# Patient Record
Sex: Female | Born: 1955 | Race: Black or African American | Hispanic: No | Marital: Single | Smoking: Never smoker
Health system: Southern US, Community
[De-identification: ages and names within clinical notes are randomized; demographics above are authoritative.]

## PROBLEM LIST (undated history)

## (undated) DIAGNOSIS — F32A Depression, unspecified: Secondary | ICD-10-CM

## (undated) DIAGNOSIS — R51 Headache: Secondary | ICD-10-CM

## (undated) DIAGNOSIS — K219 Gastro-esophageal reflux disease without esophagitis: Secondary | ICD-10-CM

## (undated) DIAGNOSIS — F988 Other specified behavioral and emotional disorders with onset usually occurring in childhood and adolescence: Secondary | ICD-10-CM

## (undated) DIAGNOSIS — F419 Anxiety disorder, unspecified: Secondary | ICD-10-CM

## (undated) DIAGNOSIS — Z974 Presence of external hearing-aid: Secondary | ICD-10-CM

## (undated) DIAGNOSIS — I10 Essential (primary) hypertension: Secondary | ICD-10-CM

## (undated) DIAGNOSIS — G4730 Sleep apnea, unspecified: Secondary | ICD-10-CM

## (undated) DIAGNOSIS — R519 Headache, unspecified: Secondary | ICD-10-CM

## (undated) DIAGNOSIS — T753XXA Motion sickness, initial encounter: Secondary | ICD-10-CM

## (undated) DIAGNOSIS — N730 Acute parametritis and pelvic cellulitis: Secondary | ICD-10-CM

## (undated) DIAGNOSIS — Z973 Presence of spectacles and contact lenses: Secondary | ICD-10-CM

## (undated) DIAGNOSIS — K449 Diaphragmatic hernia without obstruction or gangrene: Secondary | ICD-10-CM

## (undated) DIAGNOSIS — F329 Major depressive disorder, single episode, unspecified: Secondary | ICD-10-CM

## (undated) HISTORY — DX: Essential (primary) hypertension: I10

## (undated) HISTORY — DX: Depression, unspecified: F32.A

## (undated) HISTORY — PX: COLONOSCOPY: SHX174

## (undated) HISTORY — DX: Major depressive disorder, single episode, unspecified: F32.9

## (undated) HISTORY — DX: Anxiety disorder, unspecified: F41.9

## (undated) HISTORY — DX: Acute parametritis and pelvic cellulitis: N73.0

## (undated) HISTORY — DX: Gastro-esophageal reflux disease without esophagitis: K21.9

## (undated) HISTORY — DX: Other specified behavioral and emotional disorders with onset usually occurring in childhood and adolescence: F98.8

---

## 1983-11-06 HISTORY — PX: LAPAROSCOPY: SHX197

## 1983-11-06 HISTORY — PX: BUNIONECTOMY: SHX129

## 2004-09-04 ENCOUNTER — Emergency Department: Payer: Self-pay | Admitting: Emergency Medicine

## 2004-09-04 ENCOUNTER — Other Ambulatory Visit: Payer: Self-pay

## 2007-11-19 ENCOUNTER — Ambulatory Visit: Payer: Self-pay | Admitting: Internal Medicine

## 2007-12-25 ENCOUNTER — Ambulatory Visit: Payer: Self-pay | Admitting: Internal Medicine

## 2008-01-09 ENCOUNTER — Ambulatory Visit: Payer: Self-pay | Admitting: Gastroenterology

## 2008-06-25 ENCOUNTER — Ambulatory Visit: Payer: Self-pay | Admitting: Internal Medicine

## 2009-08-16 ENCOUNTER — Emergency Department: Payer: Self-pay | Admitting: Emergency Medicine

## 2010-04-11 ENCOUNTER — Ambulatory Visit: Payer: Self-pay | Admitting: Internal Medicine

## 2012-11-03 ENCOUNTER — Ambulatory Visit: Payer: Self-pay | Admitting: Internal Medicine

## 2012-11-06 ENCOUNTER — Ambulatory Visit: Payer: Self-pay | Admitting: Internal Medicine

## 2013-11-10 ENCOUNTER — Ambulatory Visit: Payer: Self-pay | Admitting: Internal Medicine

## 2015-06-14 ENCOUNTER — Other Ambulatory Visit: Payer: Self-pay | Admitting: Nurse Practitioner

## 2015-06-14 DIAGNOSIS — K449 Diaphragmatic hernia without obstruction or gangrene: Secondary | ICD-10-CM

## 2015-06-14 DIAGNOSIS — R1013 Epigastric pain: Secondary | ICD-10-CM

## 2015-06-20 ENCOUNTER — Ambulatory Visit
Admission: RE | Admit: 2015-06-20 | Discharge: 2015-06-20 | Disposition: A | Discharge status: Home / Self Care | Payer: Commercial Managed Care - HMO | Source: Ambulatory Visit | Attending: Nurse Practitioner | Admitting: Nurse Practitioner

## 2015-06-20 DIAGNOSIS — R1013 Epigastric pain: Secondary | ICD-10-CM | POA: Diagnosis present

## 2015-06-20 DIAGNOSIS — K219 Gastro-esophageal reflux disease without esophagitis: Secondary | ICD-10-CM | POA: Insufficient documentation

## 2015-06-20 DIAGNOSIS — K449 Diaphragmatic hernia without obstruction or gangrene: Secondary | ICD-10-CM | POA: Diagnosis not present

## 2015-09-27 DIAGNOSIS — G44221 Chronic tension-type headache, intractable: Secondary | ICD-10-CM | POA: Insufficient documentation

## 2015-09-27 DIAGNOSIS — G44329 Chronic post-traumatic headache, not intractable: Secondary | ICD-10-CM | POA: Insufficient documentation

## 2015-11-02 ENCOUNTER — Telehealth: Payer: Self-pay | Admitting: Gastroenterology

## 2015-11-02 ENCOUNTER — Encounter: Payer: Self-pay | Admitting: Nurse Practitioner

## 2015-11-02 NOTE — Telephone Encounter (Signed)
Left a voice message for patient to call and schedule appointment for persistent vomitting, unchanged after reflux meds changed. Notes under media

## 2015-11-16 ENCOUNTER — Ambulatory Visit (INDEPENDENT_AMBULATORY_CARE_PROVIDER_SITE_OTHER): Payer: 59 | Admitting: Obstetrics and Gynecology

## 2015-11-16 ENCOUNTER — Encounter: Payer: Self-pay | Admitting: Obstetrics and Gynecology

## 2015-11-16 VITALS — BP 138/83 | HR 105 | Ht 64.0 in | Wt 190.8 lb

## 2015-11-16 DIAGNOSIS — R109 Unspecified abdominal pain: Secondary | ICD-10-CM

## 2015-11-16 DIAGNOSIS — R1030 Lower abdominal pain, unspecified: Secondary | ICD-10-CM

## 2015-11-16 DIAGNOSIS — R935 Abnormal findings on diagnostic imaging of other abdominal regions, including retroperitoneum: Secondary | ICD-10-CM | POA: Insufficient documentation

## 2015-11-16 DIAGNOSIS — Z780 Asymptomatic menopausal state: Secondary | ICD-10-CM | POA: Insufficient documentation

## 2015-11-16 DIAGNOSIS — J45909 Unspecified asthma, uncomplicated: Secondary | ICD-10-CM | POA: Insufficient documentation

## 2015-11-16 DIAGNOSIS — I10 Essential (primary) hypertension: Secondary | ICD-10-CM | POA: Insufficient documentation

## 2015-11-16 DIAGNOSIS — K219 Gastro-esophageal reflux disease without esophagitis: Secondary | ICD-10-CM | POA: Insufficient documentation

## 2015-11-16 DIAGNOSIS — N882 Stricture and stenosis of cervix uteri: Secondary | ICD-10-CM

## 2015-11-16 DIAGNOSIS — G4730 Sleep apnea, unspecified: Secondary | ICD-10-CM | POA: Insufficient documentation

## 2015-11-16 NOTE — Progress Notes (Signed)
GYN ENCOUNTER NOTE  Subjective:       Jacqueline Arnold is a 60 y.o. G48P1001 female is here for gynecologic evaluation of the following issues:  1.  Abnormal pelvic ultrasound. 2.  Pelvic pain.  60 year old African-American female, para 1, 001, menopausal, on no hormone replacement therapy, presents for evaluation of fluid collection within the endometrial cavity that was obtained on pelvic ultrasound for evaluation of pelvic pain. Over the past month.  The patient developed a UTI and was treated with a course of antibiotics; the antibiotic was not good for the bacteria and subsequently a switch in antibiotic dosage was made; during follow-up exams.  The patient was noted to have mid pelvic tenderness, and ultrasound was obtained.  The ultrasound demonstrated a normal-appearing uterus with thin endometrial stripe, nonvisualization of the ovaries.  However, a prominent fluid collection was noted within the endometrial cavity, of unclear significance.  Patient has not been experiencing any atypical vaginal discharge or vaginal bleeding.  Past gynecologic history: Menarche-age 74. Menopause-age 47. No history of HRT therapy. No history of abnormal uterine bleeding. Laparoscopy (1985) demonstrated scar tissue of unclear origin. No history of abnormal Pap smears. No history of abnormal mammograms.  Past Medical History  Diagnosis Date  . Hypertension   . ADD (attention deficit disorder)   . GERD (gastroesophageal reflux disease)   . Anxiety   . Depression   . PID (acute pelvic inflammatory disease)    Past Surgical History  Procedure Laterality Date  . Laparoscopy  1985  . Bunionectomy Left 1985   OB History  Gravida Para Term Preterm AB SAB TAB Ectopic Multiple Living  1 1 1       1     # Outcome Date GA Lbr Len/2nd Weight Sex Delivery Anes PTL Lv  1 Term 1987   5 lb (2.268 kg) F Vag-Spont   Y       Family History  Problem Relation Age of Onset  . Heart disease Father    . Cancer Neg Hx   . Diabetes Neg Hx     The following portions of the patient's history were reviewed and updated as appropriate: allergies, current medications, past family history, past medical history, past social history, past surgical history and problem list.  Review of Systems Review of Systems - General ROS: negative for - chills, fatigue, fever, hot flashes, malaise or night sweats Hematological and Lymphatic ROS: negative for - bleeding problems or swollen lymph nodes Gastrointestinal ROS: negative for - abdominal pain, blood in stools, change in bowel habits and nausea/vomiting Musculoskeletal ROS: negative for - joint pain, muscle pain or muscular weakness Genito-Urinary ROS: negative for - change in menstrual cycle, dysmenorrhea, dyspareunia, dysuria, genital discharge, genital ulcers, hematuria, incontinence, irregular/heavy menses, nocturia or pelvic pain  Objective:   BP 138/83 mmHg  Pulse 105  Ht 5\' 4"  (1.626 m)  Wt 190 lb 12.8 oz (86.546 kg)  BMI 32.73 kg/m2 CONSTITUTIONAL: Well-developed, well-nourished female in no acute distress.  HENT:  Normocephalic, atraumatic.  NECK: Normal range of motion, supple, no masses.  Normal thyroid.  SKIN: Skin is warm and dry. No rash noted. Not diaphoretic. No erythema. No pallor. [place]: Alert and oriented to person, place, and time. PSYCHIATRIC: Normal mood and affect. Normal behavior. Normal judgment and thought content. CARDIOVASCULAR:Not Examined RESPIRATORY: Not Examined BREASTS: Not Examined ABDOMEN: Soft, non distended; Non tender.  No Organomegaly. PELVIC:  External Genitalia: Normal  BUS: Normal  Vagina: Normal  Cervix: Normal; No cervical motion  tenderness; stenotic  Uterus: Normal size, shape,consistency, mobile, Midplane, normal size and shape, slightly tender  Adnexa: Normal  RV: Normal   Bladder: Nontender MUSCULOSKELETAL: Normal range of motion. No tenderness.  No cyanosis, clubbing, or  edema.  Endometrial Biopsy Procedure Note  Pre-operative Diagnosis: Abnormal pelvic ultrasound  Post-operative Diagnosis: Same  Indications: Abnormal fluid collection within the endometrial cavity  Procedure Details   Urine pregnancy test was not done.  The risks (including infection, bleeding, pain, and uterine perforation) and benefits of the procedure were explained to the patient and Verbal informed consent was obtained.  Antibiotic prophylaxis against endocarditis was not indicated.   The patient was placed in the dorsal lithotomy position.  Bimanual exam showed the uterus to be in the neutral position.  A Graves' speculum inserted in the vagina, and the cervix prepped with povidone iodine.  Endocervical curettage with a Kevorkian curette was not performed. A paracervical block was performed with 10 cc of 1% lidocaine without epinephrine   A Single-toothed tenaculum was applied to the anterior lip of the cervix for stabilization.  A sterile uterine sound was used to Dilate the endocervical canal and sound the uterus to a depth of 8.5cm.  A Mylex 63mm curette was used to sample the endometrium.  Sample was sent for pathologic examination.  Condition: Stable  Complications: None  Plan:  The patient was advised to call for any fever or for prolonged or severe pain or bleeding. She was advised to use OTC acetaminophen and OTC ibuprofen as needed for mild to moderate pain. She was advised to avoid vaginal intercourse for 48 hours or until the bleeding has completely stopped.  Attending Physician Documentation: Brayton Mars, MD    Assessment:   1. Abnormal [place] (ultrasound) of abdomen-Endometrial fluid collection - Pathology  2. Abdominal pain in female  3.  Cervical stenosis     Plan:   1.  Endometrial biopsy with endocervical canal dilation. 2.  Advil/Tylenol as needed for cramping. 3.  Return in 2 weeks for follow-up on endometrial biopsy and further management  planning  Brayton Mars, MD  Note: This dictation was prepared with Dragon dictation along with smaller phrase technology. Any transcriptional errors that result from this process are unintentional.

## 2015-11-16 NOTE — Patient Instructions (Signed)
1.  Endometrial biopsy is done today. 2.  Return in 2 weeks for follow-up on biopsy results. 3.   If you have any cramping, you may use Advil or Tylenol. 4.  you may have some mild spotting.

## 2015-11-21 LAB — PATHOLOGY

## 2015-11-30 ENCOUNTER — Ambulatory Visit: Payer: 59 | Admitting: Obstetrics and Gynecology

## 2015-12-06 ENCOUNTER — Encounter: Payer: Self-pay | Admitting: Gastroenterology

## 2015-12-06 ENCOUNTER — Ambulatory Visit (INDEPENDENT_AMBULATORY_CARE_PROVIDER_SITE_OTHER): Payer: 59 | Admitting: Gastroenterology

## 2015-12-06 VITALS — BP 154/104 | HR 105 | Temp 98.3°F | Ht 64.0 in | Wt 192.0 lb

## 2015-12-06 DIAGNOSIS — G43A0 Cyclical vomiting, not intractable: Secondary | ICD-10-CM

## 2015-12-06 DIAGNOSIS — R1115 Cyclical vomiting syndrome unrelated to migraine: Secondary | ICD-10-CM

## 2015-12-06 NOTE — Progress Notes (Signed)
Gastroenterology Consultation  Referring Provider:     Ronnell Freshwater, NP Primary Care Physician:  Ronnell Freshwater, NP Primary Gastroenterologist:  Dr. Allen Norris     Reason for Consultation:     Nausea and vomiting        HPI:   Jacqueline Arnold is a 60 y.o. y/o female referred for consultation & management of  Nausea vomiting by Dr. Ronnell Freshwater, NP.   This patient comes in today with a history of nausea and vomiting. The patient reports that then nausea and vomiting have been going on for approximately 2 years. She states that symptoms are worse when she wakes up in the morning. She also states that when she lays down at night she can have some vomiting. Sometimes during the day when she is sitting in her car she will also have vomiting. The vomitus is usually consisting of mucus. She denies any food that she has ate many hours before coming up. There's no report of any blood with the vomiting. She also denies any change in bowel habits. The patient states that she had a colonoscopy a few years ago that was normal. The patient also reports that he has not lost any weight and in fact has gained some weight.  Past Medical History  Diagnosis Date  . Hypertension   . ADD (attention deficit disorder)   . GERD (gastroesophageal reflux disease)   . Anxiety   . Depression   . PID (acute pelvic inflammatory disease)     Past Surgical History  Procedure Laterality Date  . Laparoscopy  1985  . Bunionectomy Left 1985    Prior to Admission medications   Medication Sig Start Date End Date Taking? Authorizing Provider  amLODipine (NORVASC) 10 MG tablet  10/17/15  Yes Historical Provider, MD  lisdexamfetamine (VYVANSE) 40 MG capsule take 1 capsule by mouth once daily for FOCUS 09/19/15  Yes Historical Provider, MD  lisinopril (PRINIVIL,ZESTRIL) 20 MG tablet Take by mouth.   Yes Historical Provider, MD  metoCLOPramide (REGLAN) 10 MG tablet take 1 tablet by mouth three times a day if  needed for GERD/VOMITTING 10/20/15  Yes Historical Provider, MD  pantoprazole (PROTONIX) 40 MG tablet  09/21/15  Yes Historical Provider, MD  venlafaxine XR (EFFEXOR-XR) 150 MG 24 hr capsule Take 2 tabs in the morning and one at night 07/15/15  Yes Historical Provider, MD    Family History  Problem Relation Age of Onset  . Heart disease Father   . Cancer Neg Hx   . Diabetes Neg Hx      Social History  Substance Use Topics  . Smoking status: Never Smoker   . Smokeless tobacco: Never Used  . Alcohol Use: Yes     Comment: rare consumption    Allergies as of 12/06/2015  . (No Known Allergies)    Review of Systems:    All systems reviewed and negative except where noted in HPI.   Physical Exam:  BP 154/104 mmHg  Pulse 105  Temp(Src) 98.3 F (36.8 C) (Oral)  Ht 5\' 4"  (1.626 m)  Wt 192 lb (87.091 kg)  BMI 32.94 kg/m2 No LMP recorded. Patient is postmenopausal. Psych:  Alert and cooperative. Normal mood and affect. General:   Alert,  Well-developed, well-nourished, pleasant and cooperative in NAD Head:  Normocephalic and atraumatic. Eyes:  Sclera clear, no icterus.   Conjunctiva pink. Ears:  Normal auditory acuity. Nose:  No deformity, discharge, or lesions. Mouth:  No deformity or  lesions,oropharynx pink & moist. Neck:  Supple; no masses or thyromegaly. Lungs:  Respirations even and unlabored.  Clear throughout to auscultation.   No wheezes, crackles, or rhonchi. No acute distress. Heart:  Regular rate and rhythm; no murmurs, clicks, rubs, or gallops. Abdomen:  Normal bowel sounds.  No bruits.  Soft, Mild epigastric tenderness and non-distended without masses, hepatosplenomegaly or hernias noted.  No guarding or rebound tenderness.  Negative Carnett sign.   Rectal:  Deferred.  Msk:  Symmetrical without gross deformities.  Good, equal movement & strength bilaterally. Pulses:  Normal pulses noted. Extremities:  No clubbing or edema.  No cyanosis. Neurologic:  Alert and  oriented x3;  grossly normal neurologically. Skin:  Intact without significant lesions or rashes.  No jaundice. Lymph Nodes:  No significant cervical adenopathy. Psych:  Alert and cooperative. Normal mood and affect.  Imaging Studies: No results found.  Assessment and Plan:   Jacqueline Arnold is a 60 y.o. y/o female who comes in today with a history of nausea and vomiting for last two years. The patient takes a PPI in the morning once a day. Her symptoms are mostly consistent with nocturnal reflux with resulting morning vomiting. The patient is likely having acid breakthrough while her medication is wearing off in the middle of the night. The patient will be started on a trial of Dexilant to be given a few hours before she goes to sleep. The patient will contact me if this does not help and she may need a gastric emptying study versus a upper endoscopy. The patient had a barium swallow that showed moderate reflux with a hiatal hernia. The patient has been explained the plan and agrees with it.   Note: This dictation was prepared with Dragon dictation along with smaller phrase technology. Any transcriptional errors that result from this process are unintentional.

## 2015-12-13 ENCOUNTER — Telehealth: Payer: Self-pay | Admitting: Obstetrics and Gynecology

## 2015-12-13 NOTE — Telephone Encounter (Signed)
Pt showed up today and thought her appt was today 2/7. It was on 1/25 and she was a no show. She said if you could call her with results she would appreciate it.

## 2015-12-14 NOTE — Telephone Encounter (Signed)
Pt aware per mad emb was wnl. She will need to monitor for any bleeding. If pt develops bleeding after 05/16/2015 she will need to be seen. Pt aware bx is only good for 6 months.

## 2016-01-09 ENCOUNTER — Telehealth: Payer: Self-pay

## 2016-01-09 MED ORDER — DEXLANSOPRAZOLE 60 MG PO CPDR: 60.0000 mg | DELAYED_RELEASE_CAPSULE | Freq: Every day | ORAL | Status: DC

## 2016-01-09 NOTE — Telephone Encounter (Signed)
Patient called stating that Dr. Allen Norris had given her samples for Dexilant. Patient was to try and see if this would help her. Patient stated that it did help so to please send her a prescription to her pharmacy. I told her that I would.

## 2016-04-24 ENCOUNTER — Other Ambulatory Visit: Payer: Self-pay | Admitting: Neurology

## 2016-04-24 DIAGNOSIS — G44319 Acute post-traumatic headache, not intractable: Secondary | ICD-10-CM

## 2016-04-24 DIAGNOSIS — G44229 Chronic tension-type headache, not intractable: Secondary | ICD-10-CM

## 2016-05-18 ENCOUNTER — Ambulatory Visit
Admission: RE | Admit: 2016-05-18 | Discharge: 2016-05-18 | Disposition: A | Discharge status: Home / Self Care | Payer: 59 | Source: Ambulatory Visit | Attending: Neurology | Admitting: Neurology

## 2016-05-18 DIAGNOSIS — G44319 Acute post-traumatic headache, not intractable: Secondary | ICD-10-CM | POA: Diagnosis not present

## 2016-05-18 DIAGNOSIS — G44229 Chronic tension-type headache, not intractable: Secondary | ICD-10-CM | POA: Insufficient documentation

## 2016-05-28 ENCOUNTER — Emergency Department
Admission: EM | Admit: 2016-05-28 | Discharge: 2016-05-28 | Disposition: A | Discharge status: Home / Self Care | Payer: 59 | Attending: Emergency Medicine | Admitting: Emergency Medicine

## 2016-05-28 ENCOUNTER — Telehealth: Payer: Self-pay | Admitting: Gastroenterology

## 2016-05-28 ENCOUNTER — Emergency Department: Payer: 59

## 2016-05-28 ENCOUNTER — Encounter: Payer: Self-pay | Admitting: Emergency Medicine

## 2016-05-28 DIAGNOSIS — F909 Attention-deficit hyperactivity disorder, unspecified type: Secondary | ICD-10-CM | POA: Diagnosis not present

## 2016-05-28 DIAGNOSIS — J45909 Unspecified asthma, uncomplicated: Secondary | ICD-10-CM | POA: Diagnosis not present

## 2016-05-28 DIAGNOSIS — R079 Chest pain, unspecified: Secondary | ICD-10-CM

## 2016-05-28 DIAGNOSIS — I10 Essential (primary) hypertension: Secondary | ICD-10-CM | POA: Diagnosis not present

## 2016-05-28 DIAGNOSIS — R0789 Other chest pain: Secondary | ICD-10-CM | POA: Diagnosis present

## 2016-05-28 DIAGNOSIS — Z79899 Other long term (current) drug therapy: Secondary | ICD-10-CM | POA: Insufficient documentation

## 2016-05-28 LAB — TROPONIN I

## 2016-05-28 LAB — COMPREHENSIVE METABOLIC PANEL
ALT: 17 U/L (ref 14–54)
AST: 28 U/L (ref 15–41)
Albumin: 4.2 g/dL (ref 3.5–5.0)
Alkaline Phosphatase: 78 U/L (ref 38–126)
Anion gap: 16 — ABNORMAL HIGH (ref 5–15)
BILIRUBIN TOTAL: 0.6 mg/dL (ref 0.3–1.2)
BUN: 24 mg/dL — AB (ref 6–20)
CO2: 23 mmol/L (ref 22–32)
Calcium: 9.9 mg/dL (ref 8.9–10.3)
Chloride: 99 mmol/L — ABNORMAL LOW (ref 101–111)
Creatinine, Ser: 1.36 mg/dL — ABNORMAL HIGH (ref 0.44–1.00)
GFR, EST AFRICAN AMERICAN: 49 mL/min — AB (ref >60)
GFR, EST NON AFRICAN AMERICAN: 42 mL/min — AB (ref >60)
Glucose, Bld: 133 mg/dL — ABNORMAL HIGH (ref 65–99)
POTASSIUM: 3.1 mmol/L — AB (ref 3.5–5.1)
Sodium: 138 mmol/L (ref 135–145)
TOTAL PROTEIN: 7.7 g/dL (ref 6.5–8.1)

## 2016-05-28 LAB — CBC
HCT: 40.9 % (ref 35.0–47.0)
HEMOGLOBIN: 13.5 g/dL (ref 12.0–16.0)
MCH: 26.9 pg (ref 26.0–34.0)
MCHC: 32.9 g/dL (ref 32.0–36.0)
MCV: 81.9 fL (ref 80.0–100.0)
Platelets: 342 10*3/uL (ref 150–440)
RBC: 5 MIL/uL (ref 3.80–5.20)
RDW: 15.3 % — AB (ref 11.5–14.5)
WBC: 8.3 10*3/uL (ref 3.6–11.0)

## 2016-05-28 LAB — TYPE AND SCREEN
ABO/RH(D): O POS
ANTIBODY SCREEN: NEGATIVE

## 2016-05-28 LAB — LIPASE, BLOOD: LIPASE: 18 U/L (ref 11–51)

## 2016-05-28 LAB — FIBRIN DERIVATIVES D-DIMER (ARMC ONLY): FIBRIN DERIVATIVES D-DIMER (ARMC): 408 (ref 0–499)

## 2016-05-28 MED ORDER — ONDANSETRON HCL 4 MG/2ML IJ SOLN
4.0000 mg | Freq: Once | INTRAMUSCULAR | Status: AC
  Administered 2016-05-28: 4 mg via INTRAVENOUS

## 2016-05-28 MED ORDER — ONDANSETRON HCL 4 MG/2ML IJ SOLN
INTRAMUSCULAR | Status: AC
  Administered 2016-05-28: 4 mg via INTRAVENOUS
  Filled 2016-05-28: qty 2

## 2016-05-28 MED ORDER — SUCRALFATE 1 G PO TABS: 1.0000 g | ORAL_TABLET | Freq: Four times a day (QID) | ORAL | 0 refills | Status: DC

## 2016-05-28 MED ORDER — ONDANSETRON HCL 4 MG/2ML IJ SOLN: 4.0000 mg | Freq: Once | INTRAMUSCULAR | Status: DC

## 2016-05-28 MED ORDER — GI COCKTAIL ~~LOC~~
30.0000 mL | Freq: Once | ORAL | Status: AC
  Administered 2016-05-28: 30 mL via ORAL
  Filled 2016-05-28: qty 30

## 2016-05-28 MED ORDER — MORPHINE SULFATE (PF) 2 MG/ML IV SOLN
2.0000 mg | Freq: Once | INTRAVENOUS | Status: AC
  Administered 2016-05-28: 2 mg via INTRAVENOUS
  Filled 2016-05-28: qty 1

## 2016-05-28 NOTE — ED Provider Notes (Signed)
[facility] Emergency Department Provider Note    ____________________________________________   I have reviewed the triage vital signs and the nursing notes.   HISTORY  Chief Complaint Chest Pain   History limited by: Not Limited   HPI Jacqueline Arnold is a 60 y.o. female who presents to the emergency department today because of concern for chest and back pain as well as vomiting blood. The patient states that she has been vomiting blood for at least one month. It is burgundy in color. She has been treated for GERD by primary care but does not feel like the medication is helping for that. For the past three days the patient has been having intermittent pain in her left chest and left upper abdomen. The pain is sharp. She denies any fevers.   Past Medical History:  Diagnosis Date  . ADD (attention deficit disorder)   . Anxiety   . Depression   . GERD (gastroesophageal reflux disease)   . Hypertension   . PID (acute pelvic inflammatory disease)     Patient Active Problem List   Diagnosis Date Noted  . Abdominal pain 11/16/2015  . Stenosis, cervix 11/16/2015  . Abnormal ultrasound of uterus 11/16/2015  . Menopause 11/16/2015  . Essential hypertension 11/16/2015  . Asthma 11/16/2015  . Sleep apnea 11/16/2015  . GERD (gastroesophageal reflux disease) 11/16/2015  . Chronic post-traumatic headache 09/27/2015  . Chronic tension-type headache, intractable 09/27/2015    Past Surgical History:  Procedure Laterality Date  . BUNIONECTOMY Left 1985  . LAPAROSCOPY  1985    Current Outpatient Rx  . Order #: [id number] Class: Historical Med  . Order #: [id number] Class: Normal  . Order #: [id number] Class: Historical Med  . Order #: [id number] Class: Historical Med  . Order #: [id number] Class: Historical Med  . Order #: [id number] Class: Historical Med  . Order #: [id number] Class: Historical Med    Allergies Review of patient's allergies indicates no  known allergies.  Family History  Problem Relation Age of Onset  . Heart disease Father   . Cancer Neg Hx   . Diabetes Neg Hx     Social History Social History  Substance Use Topics  . Smoking status: Never Smoker  . Smokeless tobacco: Never Used  . Alcohol use Yes     Comment: rare consumption    Review of Systems  Constitutional: Negative for fever. Cardiovascular: Positive for chest pain.  Respiratory: Negative for shortness of breath. Gastrointestinal: Negative for abdominal pain, vomiting and diarrhea. Neurological: Negative for headaches, focal weakness or numbness.  10-point ROS otherwise negative.  ____________________________________________   PHYSICAL EXAM:  VITAL SIGNS: ED Triage Vitals  Enc Vitals Group     BP 05/28/16 0924 (!) 147/85     Pulse Rate 05/28/16 0924 (!) 123     Resp 05/28/16 0924 14     Temp --      Temp src --      SpO2 05/28/16 0924 99 %     Weight 05/28/16 0924 190 lb (86.2 kg)     Height 05/28/16 0924 5\' 6"  (1.676 m)     Head Circumference --      Peak Flow --      Pain Score 05/28/16 0915 8     Pain Loc --      Pain Edu? --      Excl. in [place]? --      Constitutional: Alert and oriented. Well appearing and in no distress. Eyes: Conjunctivae are normal. PERRL. Normal extraocular  movements. ENT   Head: Normocephalic and atraumatic.   Nose: No congestion/rhinnorhea.   Mouth/Throat: Mucous membranes are moist.   Neck: No stridor. Hematological/Lymphatic/Immunilogical: No cervical lymphadenopathy. Cardiovascular: Normal rate, regular rhythm.  No murmurs, rubs, or gallops. Respiratory: Normal respiratory effort without tachypnea nor retractions. Breath sounds are clear and equal bilaterally. No wheezes/rales/rhonchi. Gastrointestinal: Soft and nontender. No distention. Genitourinary: Deferred Musculoskeletal: Normal range of motion in all extremities. No joint effusions.  No lower extremity tenderness nor  edema. Neurologic:  Normal speech and language. No gross focal neurologic deficits are appreciated.  Skin:  Skin is warm, dry and intact. No rash noted. Psychiatric: Mood and affect are normal. Speech and behavior are normal. Patient exhibits appropriate insight and judgment.  ____________________________________________    LABS (pertinent positives/negatives)  Labs Reviewed  COMPREHENSIVE METABOLIC PANEL - Abnormal; Notable for the following:       Result Value   Potassium 3.1 (*)    Chloride 99 (*)    Glucose, Bld 133 (*)    BUN 24 (*)    Creatinine, Ser 1.36 (*)    GFR calc non Af Amer 42 (*)    GFR calc Af Amer 49 (*)    Anion gap 16 (*)    All other components within normal limits  CBC - Abnormal; Notable for the following:    RDW 15.3 (*)    All other components within normal limits  LIPASE, BLOOD  TROPONIN I  FIBRIN DERIVATIVES D-DIMER ([facility] ONLY)  TYPE AND SCREEN     ____________________________________________   EKG  I, Nance Pear, attending physician, personally viewed and interpreted this EKG  EKG Time: 0907 Rate: 131 Rhythm: sinus tachycardia Axis: normal Intervals: qtc 437 QRS: narrow ST changes: no st elevation Impression: abnormal ekg   I, Nance Pear, attending physician, personally viewed and interpreted this EKG  EKG Time: 1331 Rate: 106 Rhythm: sinus tachycardia Axis: normal Intervals: qtc 451 QRS: narrow ST changes: no st elevation Impression: abnormal ekg   ____________________________________________    RADIOLOGY  Acute abd with chest IMPRESSION: Diffuse stool throughout colon. No bowel obstruction or free air. Lungs clear.  ____________________________________________   PROCEDURES  Procedures  ____________________________________________   INITIAL IMPRESSION / ASSESSMENT AND PLAN / ED COURSE  Pertinent labs & imaging results that were available during my care of the patient were reviewed by me and  considered in my medical decision making (see chart for details).  Patient presents for history of chest pain and vomiting blood. The vomiting blood has been occurring for greater than a month. Hemaglobin today without signs of anemia. The patient had troponin, d-dimer and abd x-ray with chest done all without concerning findings. No signs of leukocytosis. Patient has been on PPI in the past but currently has not been taking any. At this point I think gastritis likely. Instructed patient to restart PPI (she states she still has prescriptions left). Will give prescription for sucralfate. Patient pain free at time of discharge.  ____________________________________________   FINAL CLINICAL IMPRESSION(S) / ED DIAGNOSES  Final diagnoses:  Nonspecific chest pain     Note: This dictation was prepared with Dragon dictation. Any transcriptional errors that result from this process are unintentional    Nance Pear, MD 05/28/16 1535

## 2016-05-28 NOTE — Telephone Encounter (Signed)
Returned pts call but vm box was full.

## 2016-05-28 NOTE — ED Triage Notes (Signed)
Patient presents to the ED with intermittent chest pain radiating into back and left side of patient's abdomen.  Patient is diaphoretic and is complaining of nausea and vomiting with burgundy blood in emesis.  Patient is complaining of mid-chest pressure.  Patient is very anxious in triage.

## 2016-05-28 NOTE — ED Notes (Signed)
Pt asleep and snoring, O2 level dropped into 80's.  Pt placed on 2L O2 for sleep.

## 2016-05-28 NOTE — ED Notes (Signed)
Patient transported to X-ray 

## 2016-05-28 NOTE — Discharge Instructions (Signed)
Please seek medical attention for any high fevers, chest pain, shortness of breath, change in behavior, persistent vomiting, bloody stool or any other new or concerning symptoms.  

## 2016-05-28 NOTE — Telephone Encounter (Signed)
Patient of Dr Allen Norris -  Her epigastric pain has worsened, and she states is also vomiting blood. Has been taking Dexilant, but states it no longer works. Does she need an urgent appointment, procedure or different medication called in to pharmacy?

## 2016-05-29 ENCOUNTER — Other Ambulatory Visit: Payer: Self-pay

## 2016-05-29 MED ORDER — ONDANSETRON 4 MG PO TBDP: 4.0000 mg | ORAL_TABLET | Freq: Three times a day (TID) | ORAL | 0 refills | Status: DC | PRN

## 2016-05-29 NOTE — Telephone Encounter (Signed)
Patient is calling again this morning. I told her that you attempted to call her yesterday and was unable to leave a message as her voice mailbox was full. She is now asking for a same day appointment.  *she was seen in the ER yesterday for chest pain, back pain and vomiting blood*   Please call at your convenience.

## 2016-05-29 NOTE — Telephone Encounter (Signed)
Spoke with pt today and have scheduled her for an EGD on Thursday, July 27th at [facility]. Nausea/vomiting R11.2. Please precert.

## 2016-05-30 ENCOUNTER — Encounter: Payer: Self-pay | Admitting: *Deleted

## 2016-05-30 NOTE — Discharge Instructions (Signed)

## 2016-05-31 ENCOUNTER — Ambulatory Visit: Payer: 59 | Admitting: Anesthesiology

## 2016-05-31 ENCOUNTER — Ambulatory Visit
Admission: RE | Admit: 2016-05-31 | Discharge: 2016-05-31 | Disposition: A | Discharge status: Home / Self Care | Payer: 59 | Source: Ambulatory Visit | Attending: Gastroenterology | Admitting: Gastroenterology

## 2016-05-31 ENCOUNTER — Encounter
Admission: RE | Disposition: A | Discharge status: Home / Self Care | Payer: Self-pay | Source: Ambulatory Visit | Attending: Gastroenterology

## 2016-05-31 DIAGNOSIS — K449 Diaphragmatic hernia without obstruction or gangrene: Secondary | ICD-10-CM

## 2016-05-31 DIAGNOSIS — F329 Major depressive disorder, single episode, unspecified: Secondary | ICD-10-CM | POA: Insufficient documentation

## 2016-05-31 DIAGNOSIS — Z8249 Family history of ischemic heart disease and other diseases of the circulatory system: Secondary | ICD-10-CM | POA: Insufficient documentation

## 2016-05-31 DIAGNOSIS — G4730 Sleep apnea, unspecified: Secondary | ICD-10-CM | POA: Insufficient documentation

## 2016-05-31 DIAGNOSIS — R112 Nausea with vomiting, unspecified: Secondary | ICD-10-CM | POA: Diagnosis not present

## 2016-05-31 DIAGNOSIS — I10 Essential (primary) hypertension: Secondary | ICD-10-CM | POA: Insufficient documentation

## 2016-05-31 DIAGNOSIS — F419 Anxiety disorder, unspecified: Secondary | ICD-10-CM | POA: Insufficient documentation

## 2016-05-31 DIAGNOSIS — Z9889 Other specified postprocedural states: Secondary | ICD-10-CM | POA: Insufficient documentation

## 2016-05-31 DIAGNOSIS — Z79899 Other long term (current) drug therapy: Secondary | ICD-10-CM | POA: Diagnosis not present

## 2016-05-31 DIAGNOSIS — K259 Gastric ulcer, unspecified as acute or chronic, without hemorrhage or perforation: Secondary | ICD-10-CM | POA: Diagnosis not present

## 2016-05-31 DIAGNOSIS — F988 Other specified behavioral and emotional disorders with onset usually occurring in childhood and adolescence: Secondary | ICD-10-CM | POA: Insufficient documentation

## 2016-05-31 DIAGNOSIS — K2100 Gastro-esophageal reflux disease with esophagitis, without bleeding: Secondary | ICD-10-CM

## 2016-05-31 HISTORY — DX: Headache: R51

## 2016-05-31 HISTORY — DX: Sleep apnea, unspecified: G47.30

## 2016-05-31 HISTORY — PX: ESOPHAGOGASTRODUODENOSCOPY (EGD) WITH PROPOFOL: SHX5813

## 2016-05-31 HISTORY — DX: Headache, unspecified: R51.9

## 2016-05-31 HISTORY — DX: Motion sickness, initial encounter: T75.3XXA

## 2016-05-31 HISTORY — DX: Presence of spectacles and contact lenses: Z97.3

## 2016-05-31 SURGERY — ESOPHAGOGASTRODUODENOSCOPY (EGD) WITH PROPOFOL
Anesthesia: Monitor Anesthesia Care | Wound class: Clean Contaminated

## 2016-05-31 MED ORDER — GLYCOPYRROLATE 0.2 MG/ML IJ SOLN
INTRAMUSCULAR | Status: DC | PRN
  Administered 2016-05-31: 0.1 mg via INTRAVENOUS

## 2016-05-31 MED ORDER — LACTATED RINGERS IV SOLN
INTRAVENOUS | Status: DC | PRN
  Administered 2016-05-31: 09:00:00 via INTRAVENOUS

## 2016-05-31 MED ORDER — PROPOFOL 10 MG/ML IV BOLUS
INTRAVENOUS | Status: DC | PRN
  Administered 2016-05-31: 150 mg via INTRAVENOUS
  Administered 2016-05-31: 50 mg via INTRAVENOUS

## 2016-05-31 MED ORDER — LIDOCAINE HCL (CARDIAC) 20 MG/ML IV SOLN
INTRAVENOUS | Status: DC | PRN
  Administered 2016-05-31: 50 mg via INTRAVENOUS

## 2016-05-31 SURGICAL SUPPLY — 32 items
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
## 2016-05-31 NOTE — Anesthesia Procedure Notes (Signed)
Procedure Name: MAC Date/Time: 05/31/2016 9:13 AM Performed by: Cameron Ali Pre-anesthesia Checklist: Patient identified, Emergency Drugs available, Suction available, Timeout performed and Patient being monitored Patient Re-evaluated:Patient Re-evaluated prior to inductionOxygen Delivery Method: Nasal cannula Placement Confirmation: positive ETCO2

## 2016-05-31 NOTE — Op Note (Signed)
[facility] Gastroenterology Patient Name: Jacqueline Arnold Procedure Date: 05/31/2016 9:12 AM MRN: [id number] Account #: [id number] Date of Birth: November 04, 1956 Admit Type: Outpatient Age: 60 Room: [facility] OR ROOM 01 Gender: Female Note Status: Finalized Procedure:            Upper GI endoscopy Indications:          Nausea with vomiting Providers:            Lucilla Lame MD, MD Referring MD:         Lavera Guise, MD (Referring MD) Medicines:            Propofol per Anesthesia Complications:        No immediate complications. Procedure:            Pre-Anesthesia Assessment:                       - Prior to the procedure, a History and Physical was                        performed, and patient medications and allergies were                        reviewed. The patient's tolerance of previous                        anesthesia was also reviewed. The risks and benefits of                        the procedure and the sedation options and risks were                        discussed with the patient. All questions were                        answered, and informed consent was obtained. Prior                        Anticoagulants: The patient has taken no previous                        anticoagulant or antiplatelet agents. ASA Grade                        Assessment: II - A patient with mild systemic disease.                        After reviewing the risks and benefits, the patient was                        deemed in satisfactory condition to undergo the                        procedure.                       After obtaining informed consent, the endoscope was                        passed under direct vision. Throughout the procedure,  the patient's blood pressure, pulse, and oxygen                        saturations were monitored continuously. The was                        introduced through the mouth, and advanced to the    second part of duodenum. The upper GI endoscopy was                        accomplished without difficulty. The patient tolerated                        the procedure well. Findings:      LA Grade D (one or more mucosal breaks involving at least 75% of       esophageal circumference) esophagitis with no bleeding was found in the       lower third of the esophagus. Biopsies were taken with a cold forceps       for histology.      A large hiatal hernia was present.      The stomach was normal.      The examined duodenum was normal. Impression:           - LA Grade D reflux esophagitis. Biopsied.                       - Large hiatal hernia.                       - Normal stomach.                       - Normal examined duodenum. Recommendation:       - Await pathology results. Procedure Code(s):    --- Professional ---                       [phone], Esophagogastroduodenoscopy, flexible, transoral;                        with biopsy, single or multiple Diagnosis Code(s):    --- Professional ---                       R11.2, Nausea with vomiting, unspecified                       K21.0, Gastro-esophageal reflux disease with esophagitis                       K44.9, Diaphragmatic hernia without obstruction or                        gangrene CPT copyright 2016 American Medical Association. All rights reserved. The codes documented in this report are preliminary and upon coder review may  be revised to meet current compliance requirements. Lucilla Lame MD, MD 05/31/2016 9:23:23 AM This report has been signed electronically. Number of Addenda: 0 Note Initiated On: 05/31/2016 9:12 AM Total Procedure Duration: 0 hours 2 minutes 45 seconds       [facility]

## 2016-05-31 NOTE — Anesthesia Preprocedure Evaluation (Signed)
Anesthesia Evaluation  Patient identified by MRN, date of birth, ID band Patient awake    Reviewed: Allergy & Precautions, H&P , NPO status , Patient's Chart, lab work & pertinent test results, reviewed documented beta blocker date and time   Airway Mallampati: II  TM Distance: >3 FB Neck ROM: full    Dental no notable dental hx.    Pulmonary asthma , sleep apnea and Continuous Positive Airway Pressure Ventilation ,    Pulmonary exam normal breath sounds clear to auscultation       Cardiovascular Exercise Tolerance: Good hypertension, On Medications  Rhythm:regular Rate:Normal     Neuro/Psych  Headaches, negative psych ROS   GI/Hepatic negative GI ROS, Neg liver ROS,   Endo/Other  negative endocrine ROS  Renal/GU negative Renal ROS  negative genitourinary   Musculoskeletal   Abdominal   Peds  Hematology negative hematology ROS (+)   Anesthesia Other Findings   Reproductive/Obstetrics negative OB ROS                             Anesthesia Physical Anesthesia Plan  ASA: II  Anesthesia Plan: MAC   Post-op Pain Management:    Induction:   Airway Management Planned:   Additional Equipment:   Intra-op Plan:   Post-operative Plan:   Informed Consent: I have reviewed the patients History and Physical, chart, labs and discussed the procedure including the risks, benefits and alternatives for the proposed anesthesia with the patient or authorized representative who has indicated his/her understanding and acceptance.     Plan Discussed with: CRNA  Anesthesia Plan Comments:         Anesthesia Quick Evaluation

## 2016-05-31 NOTE — Transfer of Care (Signed)
Immediate Anesthesia Transfer of Care Note  Patient: Jacqueline Arnold  Procedure(s) Performed: Procedure(s) with comments: ESOPHAGOGASTRODUODENOSCOPY (EGD) WITH PROPOFOL (N/A) - sleep apnea  Patient Location: PACU  Anesthesia Type: MAC  Level of Consciousness: awake, alert  and patient cooperative  Airway and Oxygen Therapy: Patient Spontanous Breathing and Patient connected to supplemental oxygen  Post-op Assessment: Post-op Vital signs reviewed, Patient's Cardiovascular Status Stable, Respiratory Function Stable, Patent Airway and No signs of Nausea or vomiting  Post-op Vital Signs: Reviewed and stable  Complications: No apparent anesthesia complications

## 2016-05-31 NOTE — Anesthesia Postprocedure Evaluation (Signed)
Anesthesia Post Note  Patient: Jacqueline Arnold  Procedure(s) Performed: Procedure(s) (LRB): ESOPHAGOGASTRODUODENOSCOPY (EGD) WITH PROPOFOL (N/A)  Patient location during evaluation: PACU Anesthesia Type: MAC Level of consciousness: awake and alert Pain management: pain level controlled Vital Signs Assessment: post-procedure vital signs reviewed and stable Respiratory status: spontaneous breathing, nonlabored ventilation and respiratory function stable Cardiovascular status: stable and blood pressure returned to baseline Anesthetic complications: no    DANIEL D KOVACS

## 2016-06-01 ENCOUNTER — Encounter: Payer: Self-pay | Admitting: Gastroenterology

## 2016-06-04 ENCOUNTER — Other Ambulatory Visit: Payer: Self-pay

## 2016-06-05 ENCOUNTER — Encounter: Payer: Self-pay | Admitting: Gastroenterology

## 2016-06-05 ENCOUNTER — Ambulatory Visit (INDEPENDENT_AMBULATORY_CARE_PROVIDER_SITE_OTHER): Payer: 59 | Admitting: Gastroenterology

## 2016-06-05 VITALS — BP 116/75 | HR 116 | Temp 99.0°F | Ht 64.0 in | Wt 198.6 lb

## 2016-06-05 DIAGNOSIS — K209 Esophagitis, unspecified without bleeding: Secondary | ICD-10-CM

## 2016-06-05 DIAGNOSIS — R1110 Vomiting, unspecified: Secondary | ICD-10-CM | POA: Diagnosis not present

## 2016-06-05 LAB — SURGICAL PATHOLOGY

## 2016-06-05 MED ORDER — PANTOPRAZOLE SODIUM 40 MG PO TBEC: 40.0000 mg | DELAYED_RELEASE_TABLET | Freq: Two times a day (BID) | ORAL | 6 refills | Status: DC
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
## 2016-06-08 ENCOUNTER — Other Ambulatory Visit: Payer: Self-pay | Admitting: Cardiology

## 2016-06-08 DIAGNOSIS — R1011 Right upper quadrant pain: Secondary | ICD-10-CM

## 2016-06-11 ENCOUNTER — Ambulatory Visit
Admission: RE | Admit: 2016-06-11 | Discharge: 2016-06-11 | Disposition: A | Discharge status: Home / Self Care | Payer: 59 | Source: Ambulatory Visit | Attending: Nurse Practitioner | Admitting: Nurse Practitioner

## 2016-06-11 ENCOUNTER — Other Ambulatory Visit: Payer: Self-pay | Admitting: Nurse Practitioner

## 2016-06-11 DIAGNOSIS — K228 Other specified diseases of esophagus: Secondary | ICD-10-CM | POA: Insufficient documentation

## 2016-06-11 DIAGNOSIS — R1010 Upper abdominal pain, unspecified: Secondary | ICD-10-CM | POA: Insufficient documentation

## 2016-06-11 MED ORDER — IOPAMIDOL (ISOVUE-300) INJECTION 61%
100.0000 mL | Freq: Once | INTRAVENOUS | Status: AC | PRN
  Administered 2016-06-11: 100 mL via INTRAVENOUS

## 2016-06-15 ENCOUNTER — Ambulatory Visit
Admission: RE | Admit: 2016-06-15 | Discharge: 2016-06-15 | Disposition: A | Discharge status: Home / Self Care | Payer: 59 | Source: Ambulatory Visit | Attending: Cardiology | Admitting: Cardiology

## 2016-06-15 DIAGNOSIS — R1011 Right upper quadrant pain: Secondary | ICD-10-CM | POA: Insufficient documentation

## 2016-06-19 DIAGNOSIS — M549 Dorsalgia, unspecified: Secondary | ICD-10-CM | POA: Diagnosis not present

## 2016-06-19 DIAGNOSIS — R079 Chest pain, unspecified: Secondary | ICD-10-CM | POA: Diagnosis not present

## 2016-06-19 DIAGNOSIS — Z79899 Other long term (current) drug therapy: Secondary | ICD-10-CM | POA: Diagnosis not present

## 2016-06-19 DIAGNOSIS — I10 Essential (primary) hypertension: Secondary | ICD-10-CM | POA: Insufficient documentation

## 2016-06-19 DIAGNOSIS — R112 Nausea with vomiting, unspecified: Secondary | ICD-10-CM | POA: Insufficient documentation

## 2016-06-19 DIAGNOSIS — J45909 Unspecified asthma, uncomplicated: Secondary | ICD-10-CM | POA: Diagnosis not present

## 2016-06-20 ENCOUNTER — Emergency Department: Payer: 59

## 2016-06-20 ENCOUNTER — Emergency Department
Admission: EM | Admit: 2016-06-20 | Discharge: 2016-06-20 | Disposition: A | Discharge status: Home / Self Care | Payer: 59 | Attending: Emergency Medicine | Admitting: Emergency Medicine

## 2016-06-20 ENCOUNTER — Encounter: Payer: Self-pay | Admitting: Emergency Medicine

## 2016-06-20 DIAGNOSIS — R079 Chest pain, unspecified: Secondary | ICD-10-CM

## 2016-06-20 HISTORY — DX: Diaphragmatic hernia without obstruction or gangrene: K44.9

## 2016-06-20 LAB — BASIC METABOLIC PANEL
Anion gap: 7 (ref 5–15)
BUN: 13 mg/dL (ref 6–20)
CHLORIDE: 108 mmol/L (ref 101–111)
CO2: 27 mmol/L (ref 22–32)
CREATININE: 1.04 mg/dL — AB (ref 0.44–1.00)
Calcium: 8.4 mg/dL — ABNORMAL LOW (ref 8.9–10.3)
GFR, EST NON AFRICAN AMERICAN: 58 mL/min — AB (ref >60)
Glucose, Bld: 106 mg/dL — ABNORMAL HIGH (ref 65–99)
POTASSIUM: 3.4 mmol/L — AB (ref 3.5–5.1)
SODIUM: 142 mmol/L (ref 135–145)

## 2016-06-20 LAB — CBC
HCT: 35.1 % (ref 35.0–47.0)
Hemoglobin: 11.7 g/dL — ABNORMAL LOW (ref 12.0–16.0)
MCH: 27.7 pg (ref 26.0–34.0)
MCHC: 33.4 g/dL (ref 32.0–36.0)
MCV: 82.7 fL (ref 80.0–100.0)
PLATELETS: 394 10*3/uL (ref 150–440)
RBC: 4.24 MIL/uL (ref 3.80–5.20)
RDW: 15.3 % — AB (ref 11.5–14.5)
WBC: 9.4 10*3/uL (ref 3.6–11.0)

## 2016-06-20 LAB — TROPONIN I: Troponin I: <0.03 ng/mL (ref <0.03)

## 2016-06-20 MED ORDER — GI COCKTAIL ~~LOC~~
30.0000 mL | Freq: Once | ORAL | Status: AC
  Administered 2016-06-20: 30 mL via ORAL
  Filled 2016-06-20: qty 30

## 2016-06-20 MED ORDER — LIDOCAINE VISCOUS 2 % MT SOLN: 15.0000 mL | Freq: Four times a day (QID) | OROMUCOSAL | 0 refills | Status: DC | PRN

## 2016-06-20 MED ORDER — TRAMADOL HCL 50 MG PO TABS: 50.0000 mg | ORAL_TABLET | Freq: Four times a day (QID) | ORAL | 0 refills | Status: DC | PRN
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
## 2016-06-20 NOTE — ED Triage Notes (Signed)
Pt presents to ED with her boyfriend with c/o left sided chest pain that radiates down her left arm and vomiting blood (X3) since around 2100 tonight. Pt reports a hx of the same about a month ago and was seen in this ED and referred to cardiology and GI specialists. GI MD performed biopsies of her esophagus and was told she had a hiatal hernia Cardiologist was seen Thursday and scheduled a stress test for 30 of august. Pt reports sob only after vomiting. Pt alert and answering questions without difficulty. No increased work of breathing or acute idstress noted at this time. Pt skin warm and dry.

## 2016-06-20 NOTE — ED Notes (Signed)
Patient stated " I throw up twice and it was burgundy color". RN notified

## 2016-06-20 NOTE — Discharge Instructions (Signed)
Please seek medical attention for any high fevers, chest pain, shortness of breath, change in behavior, persistent vomiting, bloody stool or any other new or concerning symptoms.  

## 2016-07-18 ENCOUNTER — Other Ambulatory Visit: Payer: Self-pay | Admitting: Nurse Practitioner

## 2016-07-18 DIAGNOSIS — Z1231 Encounter for screening mammogram for malignant neoplasm of breast: Secondary | ICD-10-CM

## 2016-08-02 ENCOUNTER — Telehealth: Payer: Self-pay

## 2016-08-02 NOTE — Telephone Encounter (Signed)
LMOV to call office .  [facility] referring patient to see Dr. Stevenson Clinch for Cough.

## 2016-08-06 ENCOUNTER — Ambulatory Visit
Admission: RE | Admit: 2016-08-06 | Discharge: 2016-08-06 | Disposition: A | Discharge status: Home / Self Care | Payer: 59 | Source: Ambulatory Visit | Attending: Nurse Practitioner | Admitting: Nurse Practitioner

## 2016-08-06 DIAGNOSIS — Z1231 Encounter for screening mammogram for malignant neoplasm of breast: Secondary | ICD-10-CM | POA: Insufficient documentation

## 2016-08-09 ENCOUNTER — Other Ambulatory Visit: Payer: Self-pay | Admitting: Nurse Practitioner

## 2016-08-09 ENCOUNTER — Institutional Professional Consult (permissible substitution): Payer: Commercial Managed Care - HMO | Admitting: Pulmonary Disease

## 2016-08-09 DIAGNOSIS — N6320 Unspecified lump in the left breast, unspecified quadrant: Secondary | ICD-10-CM

## 2016-08-13 ENCOUNTER — Ambulatory Visit
Admission: RE | Admit: 2016-08-13 | Discharge: 2016-08-13 | Disposition: A | Discharge status: Home / Self Care | Payer: 59 | Source: Ambulatory Visit | Attending: Nurse Practitioner | Admitting: Nurse Practitioner

## 2016-08-13 DIAGNOSIS — N63 Unspecified lump in unspecified breast: Secondary | ICD-10-CM | POA: Diagnosis present

## 2016-08-13 DIAGNOSIS — N6002 Solitary cyst of left breast: Secondary | ICD-10-CM | POA: Insufficient documentation

## 2016-08-13 DIAGNOSIS — N6320 Unspecified lump in the left breast, unspecified quadrant: Secondary | ICD-10-CM

## 2016-08-15 ENCOUNTER — Encounter: Payer: Self-pay | Admitting: Internal Medicine

## 2016-08-15 ENCOUNTER — Encounter (INDEPENDENT_AMBULATORY_CARE_PROVIDER_SITE_OTHER): Payer: Self-pay

## 2016-08-15 ENCOUNTER — Ambulatory Visit (INDEPENDENT_AMBULATORY_CARE_PROVIDER_SITE_OTHER): Payer: 59 | Admitting: Internal Medicine

## 2016-08-15 VITALS — BP 132/74 | HR 100 | Ht 64.0 in | Wt 181.4 lb

## 2016-08-15 DIAGNOSIS — R05 Cough: Secondary | ICD-10-CM

## 2016-08-15 DIAGNOSIS — J4540 Moderate persistent asthma, uncomplicated: Secondary | ICD-10-CM | POA: Diagnosis not present

## 2016-08-15 DIAGNOSIS — R059 Cough, unspecified: Secondary | ICD-10-CM

## 2016-08-15 MED ORDER — FLUTICASONE FUROATE-VILANTEROL 200-25 MCG/INH IN AEPB: 1.0000 | INHALATION_SPRAY | Freq: Every day | RESPIRATORY_TRACT | 0 refills | Status: AC

## 2016-08-15 MED ORDER — FLUTICASONE PROPIONATE 50 MCG/ACT NA SUSP: 1.0000 | Freq: Two times a day (BID) | NASAL | 2 refills | Status: DC

## 2016-08-15 MED ORDER — OMEPRAZOLE 20 MG PO CPDR: 20.0000 mg | DELAYED_RELEASE_CAPSULE | Freq: Every day | ORAL | 1 refills | Status: DC

## 2016-08-15 MED ORDER — PREDNISONE 10 MG PO TABS: ORAL_TABLET | ORAL | 0 refills | Status: DC

## 2016-08-15 MED ORDER — FLUTICASONE FUROATE-VILANTEROL 200-25 MCG/INH IN AEPB: 1.0000 | INHALATION_SPRAY | Freq: Every day | RESPIRATORY_TRACT | 5 refills | Status: DC

## 2016-08-15 NOTE — Progress Notes (Signed)
Patient ID: Jacqueline Arnold, female   DOB: Jul 26, 1956, 60 y.o.   MRN: [id number] Patient seen in the office today and instructed on use of breo ellipta.  Patient expressed understanding and demonstrated technique.
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
## 2016-08-15 NOTE — Patient Instructions (Addendum)
--  Will start omeprazole 20 mg daily.   --Flonase 1 spray each nostril twice daily.   --Breo 200 once daily, rinse mouth after each use.   --Prednisone 10 mg tabs x 42.  Take 6 tabs for 2 days, then 5 tabs for 2 day, 4 tabs for 2 day, 3 tabs for 2 days, 1 tab for 2 days- then stop.

## 2016-09-19 ENCOUNTER — Ambulatory Visit: Payer: 59 | Admitting: Internal Medicine

## 2016-09-19 ENCOUNTER — Encounter: Payer: Self-pay | Admitting: *Deleted
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
## 2016-09-25 ENCOUNTER — Encounter: Payer: Self-pay | Admitting: Internal Medicine

## 2017-09-12 ENCOUNTER — Other Ambulatory Visit: Payer: Self-pay | Admitting: Nurse Practitioner

## 2017-09-12 ENCOUNTER — Other Ambulatory Visit: Payer: Self-pay | Admitting: Internal Medicine

## 2017-09-12 DIAGNOSIS — Z1231 Encounter for screening mammogram for malignant neoplasm of breast: Secondary | ICD-10-CM

## 2017-10-08 ENCOUNTER — Ambulatory Visit
Admission: RE | Admit: 2017-10-08 | Discharge: 2017-10-08 | Disposition: A | Discharge status: Home / Self Care | Payer: 59 | Source: Ambulatory Visit | Attending: Nurse Practitioner | Admitting: Nurse Practitioner

## 2017-10-08 DIAGNOSIS — Z1231 Encounter for screening mammogram for malignant neoplasm of breast: Secondary | ICD-10-CM | POA: Insufficient documentation

## 2017-11-12 ENCOUNTER — Encounter: Payer: Self-pay | Admitting: Nurse Practitioner

## 2017-11-26 ENCOUNTER — Other Ambulatory Visit: Payer: Self-pay

## 2018-01-03 ENCOUNTER — Encounter: Payer: Self-pay | Admitting: Nurse Practitioner

## 2018-01-08 ENCOUNTER — Ambulatory Visit (INDEPENDENT_AMBULATORY_CARE_PROVIDER_SITE_OTHER): Payer: Managed Care, Other (non HMO) | Admitting: Nurse Practitioner

## 2018-01-08 ENCOUNTER — Encounter: Payer: Self-pay | Admitting: Nurse Practitioner

## 2018-01-08 VITALS — BP 140/90 | HR 80 | Resp 16 | Ht 64.0 in | Wt 174.0 lb

## 2018-01-08 DIAGNOSIS — R300 Dysuria: Secondary | ICD-10-CM | POA: Diagnosis not present

## 2018-01-08 DIAGNOSIS — I10 Essential (primary) hypertension: Secondary | ICD-10-CM

## 2018-01-08 DIAGNOSIS — Z124 Encounter for screening for malignant neoplasm of cervix: Secondary | ICD-10-CM

## 2018-01-08 DIAGNOSIS — F329 Major depressive disorder, single episode, unspecified: Secondary | ICD-10-CM | POA: Insufficient documentation

## 2018-01-08 DIAGNOSIS — Z0001 Encounter for general adult medical examination with abnormal findings: Secondary | ICD-10-CM

## 2018-01-08 DIAGNOSIS — F341 Dysthymic disorder: Secondary | ICD-10-CM

## 2018-01-08 DIAGNOSIS — E668 Other obesity: Secondary | ICD-10-CM | POA: Insufficient documentation

## 2018-01-08 DIAGNOSIS — E559 Vitamin D deficiency, unspecified: Secondary | ICD-10-CM | POA: Insufficient documentation

## 2018-01-08 DIAGNOSIS — L209 Atopic dermatitis, unspecified: Secondary | ICD-10-CM | POA: Diagnosis not present

## 2018-01-08 MED ORDER — AMLODIPINE BESYLATE 10 MG PO TABS: 10.0000 mg | ORAL_TABLET | Freq: Every day | ORAL | 4 refills | Status: DC

## 2018-01-08 MED ORDER — CLOTRIMAZOLE-BETAMETHASONE 1-0.05 % EX CREA: 1.0000 "application " | TOPICAL_CREAM | Freq: Two times a day (BID) | CUTANEOUS | 1 refills | Status: DC

## 2018-01-08 MED ORDER — LIRAGLUTIDE -WEIGHT MANAGEMENT 18 MG/3ML ~~LOC~~ SOPN: 3.0000 mg | PEN_INJECTOR | Freq: Every day | SUBCUTANEOUS | 4 refills | Status: DC

## 2018-01-08 MED ORDER — VENLAFAXINE HCL ER 150 MG PO CP24: 150.0000 mg | ORAL_CAPSULE | Freq: Every day | ORAL | 4 refills | Status: DC
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
## 2018-01-09 LAB — MICROSCOPIC EXAMINATION: Casts: NONE SEEN /lpf

## 2018-01-09 LAB — URINALYSIS, ROUTINE W REFLEX MICROSCOPIC
BILIRUBIN UA: NEGATIVE
Glucose, UA: NEGATIVE
Nitrite, UA: NEGATIVE
Protein, UA: NEGATIVE
RBC UA: NEGATIVE
SPEC GRAV UA: 1.02 (ref 1.005–1.030)
Urobilinogen, Ur: 0.2 mg/dL (ref 0.2–1.0)
pH, UA: 5 (ref 5.0–7.5)

## 2018-01-11 LAB — PAP IG AND HPV HIGH-RISK
HPV, HIGH-RISK: NEGATIVE
PAP Smear Comment: 0

## 2018-01-15 ENCOUNTER — Telehealth: Payer: Self-pay

## 2018-01-15 NOTE — Telephone Encounter (Signed)
Left vm that pap results were normal.  dbs

## 2018-01-15 NOTE — Telephone Encounter (Signed)
-----   Message from Ronnell Freshwater, NP sent at 01/11/2018  7:46 PM EST ----- Please let the patient know that her pap smear is normal. thanks
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
## 2018-07-03 NOTE — Progress Notes (Signed)
[place] 12/13/2018

## 2018-11-27 ENCOUNTER — Other Ambulatory Visit: Payer: Self-pay | Admitting: Internal Medicine

## 2018-11-27 DIAGNOSIS — Z1231 Encounter for screening mammogram for malignant neoplasm of breast: Secondary | ICD-10-CM

## 2018-12-11 ENCOUNTER — Ambulatory Visit
Admission: RE | Admit: 2018-12-11 | Discharge: 2018-12-11 | Disposition: A | Discharge status: Home / Self Care | Payer: Managed Care, Other (non HMO) | Source: Ambulatory Visit | Attending: Internal Medicine | Admitting: Internal Medicine

## 2018-12-11 DIAGNOSIS — Z1231 Encounter for screening mammogram for malignant neoplasm of breast: Secondary | ICD-10-CM | POA: Diagnosis not present

## 2019-02-09 ENCOUNTER — Ambulatory Visit: Payer: Managed Care, Other (non HMO) | Admitting: Adult Health

## 2019-02-09 ENCOUNTER — Other Ambulatory Visit: Payer: Self-pay

## 2019-02-09 ENCOUNTER — Encounter: Payer: Self-pay | Admitting: Adult Health

## 2019-02-09 VITALS — BP 129/101 | HR 125 | Resp 16 | Ht 64.0 in | Wt 183.0 lb

## 2019-02-09 DIAGNOSIS — I10 Essential (primary) hypertension: Secondary | ICD-10-CM

## 2019-02-09 DIAGNOSIS — F329 Major depressive disorder, single episode, unspecified: Secondary | ICD-10-CM | POA: Diagnosis not present

## 2019-02-09 MED ORDER — VENLAFAXINE HCL ER 150 MG PO CP24: 150.0000 mg | ORAL_CAPSULE | Freq: Every day | ORAL | 0 refills | Status: DC

## 2019-02-09 MED ORDER — VENLAFAXINE HCL ER 150 MG PO CP24: 150.0000 mg | ORAL_CAPSULE | Freq: Every day | ORAL | 3 refills | Status: DC
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
## 2019-02-10 ENCOUNTER — Telehealth: Payer: Self-pay | Admitting: Adult Health

## 2019-02-10 NOTE — Telephone Encounter (Signed)
Spoke with patient in regards to her visit yesterday , patients bp has come down, 136/89 and pulse is 104 . Patient states that she still has that throbby toothache going on with the left arm and side of chest , advised patient to call If symptoms worsen , and or lingers on , she will need to be seen, advised not to hesitate if she feels she needs to go to the emergency room.

## 2019-02-11 ENCOUNTER — Ambulatory Visit: Payer: Managed Care, Other (non HMO) | Admitting: Internal Medicine

## 2019-02-11 ENCOUNTER — Telehealth: Payer: Self-pay

## 2019-02-11 ENCOUNTER — Other Ambulatory Visit: Payer: Self-pay

## 2019-02-11 ENCOUNTER — Telehealth: Payer: Self-pay | Admitting: Adult Health

## 2019-02-11 ENCOUNTER — Other Ambulatory Visit: Payer: Self-pay | Admitting: Internal Medicine

## 2019-02-11 ENCOUNTER — Encounter: Payer: Self-pay | Admitting: Internal Medicine

## 2019-02-11 VITALS — BP 139/84 | HR 102 | Resp 16 | Ht 64.0 in | Wt 183.0 lb

## 2019-02-11 DIAGNOSIS — R079 Chest pain, unspecified: Secondary | ICD-10-CM | POA: Diagnosis not present

## 2019-02-11 DIAGNOSIS — I10 Essential (primary) hypertension: Secondary | ICD-10-CM

## 2019-02-11 DIAGNOSIS — J208 Acute bronchitis due to other specified organisms: Secondary | ICD-10-CM | POA: Diagnosis not present

## 2019-02-11 MED ORDER — FLUTICASONE-SALMETEROL 115-21 MCG/ACT IN AERO: 2.0000 | INHALATION_SPRAY | Freq: Two times a day (BID) | RESPIRATORY_TRACT | 1 refills | Status: DC

## 2019-02-11 MED ORDER — AZITHROMYCIN 250 MG PO TABS: ORAL_TABLET | ORAL | 0 refills | Status: DC

## 2019-02-11 NOTE — Telephone Encounter (Signed)
Called ph[facility] and make sure they received both pres and they did working on zpak and advair inhaler

## 2019-02-11 NOTE — Telephone Encounter (Signed)
She called and states that her symptoms have changed some, she states that her bp is under control but her cough more productive and she is having a sore throat Adam did a video call with her on Monday , would you like to do a video call with her or have any suggestions for her?
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
## 2019-02-11 NOTE — Telephone Encounter (Signed)
Pt advised that we send zpak  And inhaler to [facility] and we call back today or tomorrow with result

## 2019-02-12 ENCOUNTER — Telehealth: Payer: Self-pay

## 2019-02-12 ENCOUNTER — Other Ambulatory Visit: Payer: Self-pay

## 2019-02-12 DIAGNOSIS — I10 Essential (primary) hypertension: Secondary | ICD-10-CM

## 2019-02-12 LAB — COMPREHENSIVE METABOLIC PANEL
ALT: 35 IU/L — ABNORMAL HIGH (ref 0–32)
AST: 25 IU/L (ref 0–40)
Albumin/Globulin Ratio: 1.7 (ref 1.2–2.2)
Albumin: 4 g/dL (ref 3.8–4.9)
Alkaline Phosphatase: 83 IU/L (ref 39–117)
BUN/Creatinine Ratio: 18 (ref 12–28)
BUN: 16 mg/dL (ref 8–27)
Bilirubin Total: 0.4 mg/dL (ref 0.0–1.2)
CO2: 20 mmol/L (ref 20–29)
Calcium: 9 mg/dL (ref 8.7–10.3)
Chloride: 106 mmol/L (ref 96–106)
Creatinine, Ser: 0.89 mg/dL (ref 0.57–1.00)
GFR calc Af Amer: 81 mL/min/{1.73_m2} (ref >59)
GFR calc non Af Amer: 71 mL/min/{1.73_m2} (ref >59)
Globulin, Total: 2.3 g/dL (ref 1.5–4.5)
Glucose: 100 mg/dL — ABNORMAL HIGH (ref 65–99)
Potassium: 4.1 mmol/L (ref 3.5–5.2)
Sodium: 141 mmol/L (ref 134–144)
Total Protein: 6.3 g/dL (ref 6.0–8.5)

## 2019-02-12 LAB — CBC WITH DIFFERENTIAL
Basophils Absolute: 0.1 10*3/uL (ref 0.0–0.2)
Basos: 1 %
EOS (ABSOLUTE): 0.2 10*3/uL (ref 0.0–0.4)
Eos: 3 %
Hematocrit: 33.2 % — ABNORMAL LOW (ref 34.0–46.6)
Hemoglobin: 11.2 g/dL (ref 11.1–15.9)
Immature Grans (Abs): 0 10*3/uL (ref 0.0–0.1)
Immature Granulocytes: 0 %
Lymphocytes Absolute: 2.1 10*3/uL (ref 0.7–3.1)
Lymphs: 28 %
MCH: 28.2 pg (ref 26.6–33.0)
MCHC: 33.7 g/dL (ref 31.5–35.7)
MCV: 84 fL (ref 79–97)
Monocytes Absolute: 0.5 10*3/uL (ref 0.1–0.9)
Monocytes: 6 %
Neutrophils Absolute: 4.6 10*3/uL (ref 1.4–7.0)
Neutrophils: 62 %
RBC: 3.97 x10E6/uL (ref 3.77–5.28)
RDW: 13.6 % (ref 11.7–15.4)
WBC: 7.6 10*3/uL (ref 3.4–10.8)

## 2019-02-12 LAB — CK TOTAL AND CKMB (NOT AT ARMC)
CK-MB Index: 2.1 ng/mL (ref 0.0–5.3)
Total CK: 224 U/L — ABNORMAL HIGH (ref 24–173)

## 2019-02-12 LAB — TROPONIN I: Troponin I: <0.01 ng/mL (ref 0.00–0.04)

## 2019-02-12 LAB — D-DIMER, QUANTITATIVE: D-DIMER: 0.27 mg/L FEU (ref 0.00–0.49)

## 2019-02-12 MED ORDER — AMLODIPINE BESYLATE 10 MG PO TABS: 10.0000 mg | ORAL_TABLET | Freq: Every day | ORAL | 0 refills | Status: DC

## 2019-02-12 NOTE — Telephone Encounter (Signed)
Spoke with she doing better and she going start med today

## 2019-02-12 NOTE — Telephone Encounter (Signed)
Faxed signed copies of patient disability paperwork back to employer [employer] and put copy in scan. Beth

## 2019-02-16 NOTE — Progress Notes (Signed)
Can u check on her please

## 2019-02-17 ENCOUNTER — Telehealth: Payer: Self-pay

## 2019-02-17 NOTE — Telephone Encounter (Signed)
Called to check on pt did not get an answer left a message for pt to give [place] a call back.

## 2019-03-17 ENCOUNTER — Encounter: Payer: Self-pay | Admitting: Adult Health

## 2019-05-05 ENCOUNTER — Other Ambulatory Visit: Payer: Self-pay | Admitting: Internal Medicine

## 2019-05-05 DIAGNOSIS — I10 Essential (primary) hypertension: Secondary | ICD-10-CM

## 2019-05-14 ENCOUNTER — Ambulatory Visit: Payer: Managed Care, Other (non HMO) | Admitting: Nurse Practitioner

## 2019-06-09 ENCOUNTER — Encounter: Payer: Self-pay | Admitting: Adult Health

## 2019-07-30 ENCOUNTER — Other Ambulatory Visit: Payer: Self-pay

## 2019-07-30 DIAGNOSIS — I10 Essential (primary) hypertension: Secondary | ICD-10-CM

## 2019-07-30 MED ORDER — AMLODIPINE BESYLATE 10 MG PO TABS: 10.0000 mg | ORAL_TABLET | Freq: Every day | ORAL | 0 refills | Status: DC

## 2019-08-18 ENCOUNTER — Other Ambulatory Visit: Payer: Self-pay

## 2019-08-18 ENCOUNTER — Encounter: Payer: Self-pay | Admitting: Adult Health

## 2019-08-18 ENCOUNTER — Ambulatory Visit: Payer: Managed Care, Other (non HMO) | Admitting: Adult Health

## 2019-08-18 VITALS — Ht 64.0 in | Wt 172.0 lb

## 2019-08-18 DIAGNOSIS — F329 Major depressive disorder, single episode, unspecified: Secondary | ICD-10-CM | POA: Diagnosis not present

## 2019-08-18 DIAGNOSIS — L209 Atopic dermatitis, unspecified: Secondary | ICD-10-CM

## 2019-08-18 DIAGNOSIS — I10 Essential (primary) hypertension: Secondary | ICD-10-CM

## 2019-08-18 DIAGNOSIS — K219 Gastro-esophageal reflux disease without esophagitis: Secondary | ICD-10-CM | POA: Diagnosis not present

## 2019-08-18 MED ORDER — BUPROPION HCL 100 MG PO TABS: 100.0000 mg | ORAL_TABLET | Freq: Every day | ORAL | 2 refills | Status: DC

## 2019-08-18 MED ORDER — CLOTRIMAZOLE-BETAMETHASONE 1-0.05 % EX CREA: 1.0000 "application " | TOPICAL_CREAM | Freq: Two times a day (BID) | CUTANEOUS | 1 refills | Status: DC

## 2019-08-18 MED ORDER — AMLODIPINE BESYLATE 10 MG PO TABS: 10.0000 mg | ORAL_TABLET | Freq: Every day | ORAL | 0 refills | Status: DC

## 2019-08-18 MED ORDER — OMEPRAZOLE 40 MG PO CPDR: 40.0000 mg | DELAYED_RELEASE_CAPSULE | Freq: Every day | ORAL | 3 refills | Status: DC
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
## 2019-09-25 ENCOUNTER — Telehealth: Payer: Self-pay

## 2019-09-25 NOTE — Telephone Encounter (Signed)
SCREENED AND CONFIRMED 09-29-19 OV.

## 2019-09-25 NOTE — Telephone Encounter (Signed)
Called lmom informing patient of appointment. klh 

## 2019-09-29 ENCOUNTER — Ambulatory Visit: Payer: Managed Care, Other (non HMO) | Admitting: Adult Health

## 2019-09-29 ENCOUNTER — Other Ambulatory Visit: Payer: Self-pay

## 2019-09-29 ENCOUNTER — Encounter: Payer: Self-pay | Admitting: Adult Health

## 2019-09-29 ENCOUNTER — Encounter (INDEPENDENT_AMBULATORY_CARE_PROVIDER_SITE_OTHER): Payer: Self-pay

## 2019-09-29 VITALS — BP 140/84 | HR 96 | Temp 97.1°F | Resp 16 | Ht 64.0 in | Wt 177.0 lb

## 2019-09-29 DIAGNOSIS — F329 Major depressive disorder, single episode, unspecified: Secondary | ICD-10-CM | POA: Diagnosis not present

## 2019-09-29 DIAGNOSIS — K219 Gastro-esophageal reflux disease without esophagitis: Secondary | ICD-10-CM

## 2019-09-29 DIAGNOSIS — Z23 Encounter for immunization: Secondary | ICD-10-CM

## 2019-09-29 DIAGNOSIS — I10 Essential (primary) hypertension: Secondary | ICD-10-CM | POA: Diagnosis not present

## 2019-09-29 DIAGNOSIS — M25539 Pain in unspecified wrist: Secondary | ICD-10-CM | POA: Insufficient documentation
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
## 2019-10-26 ENCOUNTER — Other Ambulatory Visit: Payer: Self-pay

## 2019-10-26 DIAGNOSIS — F329 Major depressive disorder, single episode, unspecified: Secondary | ICD-10-CM

## 2019-10-26 MED ORDER — BUPROPION HCL 100 MG PO TABS: 100.0000 mg | ORAL_TABLET | Freq: Every day | ORAL | 1 refills | Status: DC

## 2019-10-29 ENCOUNTER — Telehealth: Payer: Self-pay

## 2019-10-29 NOTE — Telephone Encounter (Signed)
Confirmed appointment with patient. klh °

## 2019-11-03 ENCOUNTER — Ambulatory Visit: Payer: Managed Care, Other (non HMO) | Admitting: Adult Health

## 2019-11-03 DIAGNOSIS — Z008 Encounter for other general examination: Secondary | ICD-10-CM

## 2019-11-13 ENCOUNTER — Telehealth: Payer: Self-pay

## 2019-11-13 NOTE — Telephone Encounter (Signed)
Billed patient missed appt fee. 11/03/19

## 2019-11-20 ENCOUNTER — Other Ambulatory Visit: Payer: Self-pay | Admitting: Adult Health

## 2019-11-20 DIAGNOSIS — I10 Essential (primary) hypertension: Secondary | ICD-10-CM

## 2019-11-22 ENCOUNTER — Other Ambulatory Visit: Payer: Self-pay | Admitting: Adult Health

## 2019-11-22 DIAGNOSIS — F329 Major depressive disorder, single episode, unspecified: Secondary | ICD-10-CM

## 2019-12-07 ENCOUNTER — Other Ambulatory Visit: Payer: Self-pay

## 2019-12-07 DIAGNOSIS — K219 Gastro-esophageal reflux disease without esophagitis: Secondary | ICD-10-CM

## 2019-12-07 MED ORDER — OMEPRAZOLE 40 MG PO CPDR: 40.0000 mg | DELAYED_RELEASE_CAPSULE | Freq: Every day | ORAL | 1 refills | Status: DC

## 2019-12-30 ENCOUNTER — Other Ambulatory Visit: Payer: Self-pay | Admitting: Adult Health

## 2019-12-30 DIAGNOSIS — F329 Major depressive disorder, single episode, unspecified: Secondary | ICD-10-CM

## 2020-01-04 ENCOUNTER — Other Ambulatory Visit: Payer: Self-pay

## 2020-01-04 DIAGNOSIS — F329 Major depressive disorder, single episode, unspecified: Secondary | ICD-10-CM

## 2020-01-04 MED ORDER — BUPROPION HCL 100 MG PO TABS: 100.0000 mg | ORAL_TABLET | Freq: Every day | ORAL | 0 refills | Status: DC

## 2020-01-07 ENCOUNTER — Other Ambulatory Visit: Payer: Self-pay

## 2020-01-07 DIAGNOSIS — I10 Essential (primary) hypertension: Secondary | ICD-10-CM

## 2020-01-07 MED ORDER — AMLODIPINE BESYLATE 10 MG PO TABS: ORAL_TABLET | ORAL | 0 refills | Status: DC

## 2020-02-02 ENCOUNTER — Telehealth: Payer: Self-pay

## 2020-02-02 NOTE — Telephone Encounter (Signed)
Confirmed appointment on 02/04/2020 and screened for covid. klh 

## 2020-02-03 ENCOUNTER — Ambulatory Visit: Payer: Managed Care, Other (non HMO) | Admitting: Adult Health

## 2020-02-04 ENCOUNTER — Encounter: Payer: Self-pay | Admitting: Adult Health

## 2020-02-04 ENCOUNTER — Other Ambulatory Visit: Payer: Self-pay

## 2020-02-04 ENCOUNTER — Ambulatory Visit: Payer: Managed Care, Other (non HMO) | Admitting: Adult Health

## 2020-02-04 VITALS — BP 127/90 | HR 102 | Temp 97.5°F | Resp 16 | Ht 64.0 in | Wt 183.0 lb

## 2020-02-04 DIAGNOSIS — R300 Dysuria: Secondary | ICD-10-CM | POA: Diagnosis not present

## 2020-02-04 DIAGNOSIS — N390 Urinary tract infection, site not specified: Secondary | ICD-10-CM

## 2020-02-04 DIAGNOSIS — F329 Major depressive disorder, single episode, unspecified: Secondary | ICD-10-CM

## 2020-02-04 DIAGNOSIS — I10 Essential (primary) hypertension: Secondary | ICD-10-CM

## 2020-02-04 DIAGNOSIS — K219 Gastro-esophageal reflux disease without esophagitis: Secondary | ICD-10-CM

## 2020-02-04 LAB — POCT URINALYSIS DIPSTICK
Bilirubin, UA: NEGATIVE
Blood, UA: NEGATIVE
Glucose, UA: NEGATIVE
Ketones, UA: NEGATIVE
Nitrite, UA: NEGATIVE
Protein, UA: POSITIVE — AB
Spec Grav, UA: 1.01 (ref 1.010–1.025)
Urobilinogen, UA: 0.2 E.U./dL
pH, UA: 5 (ref 5.0–8.0)

## 2020-02-04 MED ORDER — OMEPRAZOLE 40 MG PO CPDR: 40.0000 mg | DELAYED_RELEASE_CAPSULE | Freq: Every day | ORAL | 1 refills | Status: DC

## 2020-02-04 MED ORDER — BUPROPION HCL ER (SR) 150 MG PO TB12: 150.0000 mg | ORAL_TABLET | Freq: Two times a day (BID) | ORAL | 0 refills | Status: DC

## 2020-02-04 MED ORDER — NITROFURANTOIN MONOHYD MACRO 100 MG PO CAPS: 100.0000 mg | ORAL_CAPSULE | Freq: Two times a day (BID) | ORAL | 0 refills | Status: DC
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
## 2020-02-08 LAB — CULTURE, URINE COMPREHENSIVE

## 2020-03-20 ENCOUNTER — Other Ambulatory Visit: Payer: Self-pay | Admitting: Adult Health

## 2020-03-20 DIAGNOSIS — F329 Major depressive disorder, single episode, unspecified: Secondary | ICD-10-CM

## 2020-03-27 ENCOUNTER — Other Ambulatory Visit: Payer: Self-pay | Admitting: Adult Health

## 2020-03-27 DIAGNOSIS — I10 Essential (primary) hypertension: Secondary | ICD-10-CM

## 2020-04-06 ENCOUNTER — Other Ambulatory Visit: Payer: Self-pay | Admitting: Adult Health

## 2020-04-06 DIAGNOSIS — F329 Major depressive disorder, single episode, unspecified: Secondary | ICD-10-CM

## 2020-04-15 ENCOUNTER — Other Ambulatory Visit: Payer: Self-pay | Admitting: Adult Health

## 2020-04-15 DIAGNOSIS — F329 Major depressive disorder, single episode, unspecified: Secondary | ICD-10-CM

## 2020-04-15 NOTE — Telephone Encounter (Signed)
No future app, update gaps if needed

## 2020-04-21 ENCOUNTER — Telehealth: Payer: Self-pay

## 2020-04-21 NOTE — Telephone Encounter (Signed)
Left message and asked pt to call and schedule appointment for future refills. Beth

## 2020-05-19 ENCOUNTER — Telehealth: Payer: Self-pay

## 2020-05-19 NOTE — Telephone Encounter (Signed)
Lmom to confirm and screen for 05-23-20 ov. °

## 2020-05-23 ENCOUNTER — Other Ambulatory Visit: Payer: Self-pay

## 2020-05-23 ENCOUNTER — Encounter: Payer: Self-pay | Admitting: Nurse Practitioner

## 2020-05-23 ENCOUNTER — Other Ambulatory Visit: Payer: Self-pay | Admitting: Adult Health

## 2020-05-23 ENCOUNTER — Ambulatory Visit (INDEPENDENT_AMBULATORY_CARE_PROVIDER_SITE_OTHER): Payer: Managed Care, Other (non HMO) | Admitting: Nurse Practitioner

## 2020-05-23 VITALS — BP 138/84 | HR 103 | Temp 97.4°F | Resp 16 | Ht 64.0 in | Wt 189.0 lb

## 2020-05-23 DIAGNOSIS — L209 Atopic dermatitis, unspecified: Secondary | ICD-10-CM

## 2020-05-23 DIAGNOSIS — R300 Dysuria: Secondary | ICD-10-CM

## 2020-05-23 DIAGNOSIS — Z0001 Encounter for general adult medical examination with abnormal findings: Secondary | ICD-10-CM

## 2020-05-23 DIAGNOSIS — F329 Major depressive disorder, single episode, unspecified: Secondary | ICD-10-CM

## 2020-05-23 DIAGNOSIS — I10 Essential (primary) hypertension: Secondary | ICD-10-CM | POA: Diagnosis not present

## 2020-05-23 DIAGNOSIS — D485 Neoplasm of uncertain behavior of skin: Secondary | ICD-10-CM | POA: Diagnosis not present

## 2020-05-23 DIAGNOSIS — Z1231 Encounter for screening mammogram for malignant neoplasm of breast: Secondary | ICD-10-CM

## 2020-05-23 DIAGNOSIS — E668 Other obesity: Secondary | ICD-10-CM

## 2020-05-23 DIAGNOSIS — Z1211 Encounter for screening for malignant neoplasm of colon: Secondary | ICD-10-CM
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
## 2020-06-01 ENCOUNTER — Ambulatory Visit
Admission: RE | Admit: 2020-06-01 | Discharge: 2020-06-01 | Disposition: A | Discharge status: Home / Self Care | Payer: Managed Care, Other (non HMO) | Source: Ambulatory Visit | Attending: Adult Health | Admitting: Adult Health

## 2020-06-01 DIAGNOSIS — Z1231 Encounter for screening mammogram for malignant neoplasm of breast: Secondary | ICD-10-CM | POA: Insufficient documentation

## 2020-06-02 ENCOUNTER — Other Ambulatory Visit: Payer: Self-pay | Admitting: Adult Health

## 2020-06-02 DIAGNOSIS — N6320 Unspecified lump in the left breast, unspecified quadrant: Secondary | ICD-10-CM

## 2020-06-02 DIAGNOSIS — R928 Other abnormal and inconclusive findings on diagnostic imaging of breast: Secondary | ICD-10-CM

## 2020-06-10 ENCOUNTER — Other Ambulatory Visit: Payer: Self-pay | Admitting: Nurse Practitioner

## 2020-06-10 DIAGNOSIS — R928 Other abnormal and inconclusive findings on diagnostic imaging of breast: Secondary | ICD-10-CM

## 2020-06-10 DIAGNOSIS — N6320 Unspecified lump in the left breast, unspecified quadrant: Secondary | ICD-10-CM

## 2020-06-15 DIAGNOSIS — R300 Dysuria: Secondary | ICD-10-CM | POA: Insufficient documentation

## 2020-06-15 DIAGNOSIS — Z0001 Encounter for general adult medical examination with abnormal findings: Secondary | ICD-10-CM | POA: Insufficient documentation

## 2020-06-15 DIAGNOSIS — Z1211 Encounter for screening for malignant neoplasm of colon: Secondary | ICD-10-CM | POA: Insufficient documentation

## 2020-06-15 DIAGNOSIS — D485 Neoplasm of uncertain behavior of skin: Secondary | ICD-10-CM | POA: Insufficient documentation

## 2020-06-23 ENCOUNTER — Other Ambulatory Visit: Payer: Self-pay

## 2020-06-23 ENCOUNTER — Ambulatory Visit
Admission: RE | Admit: 2020-06-23 | Discharge: 2020-06-23 | Disposition: A | Discharge status: Home / Self Care | Payer: Managed Care, Other (non HMO) | Source: Ambulatory Visit | Attending: Nurse Practitioner | Admitting: Nurse Practitioner

## 2020-06-23 DIAGNOSIS — R928 Other abnormal and inconclusive findings on diagnostic imaging of breast: Secondary | ICD-10-CM | POA: Insufficient documentation

## 2020-06-23 DIAGNOSIS — N6320 Unspecified lump in the left breast, unspecified quadrant: Secondary | ICD-10-CM | POA: Insufficient documentation

## 2020-07-27 ENCOUNTER — Ambulatory Visit: Payer: Managed Care, Other (non HMO) | Admitting: Dermatology

## 2020-07-27 ENCOUNTER — Other Ambulatory Visit: Payer: Self-pay

## 2020-07-27 DIAGNOSIS — L820 Inflamed seborrheic keratosis: Secondary | ICD-10-CM | POA: Diagnosis not present

## 2020-07-27 DIAGNOSIS — L821 Other seborrheic keratosis: Secondary | ICD-10-CM | POA: Diagnosis not present

## 2020-07-27 DIAGNOSIS — R21 Rash and other nonspecific skin eruption: Secondary | ICD-10-CM

## 2020-07-27 NOTE — Patient Instructions (Addendum)
Cryotherapy Aftercare  . Wash gently with soap and water everyday.   . Apply Vaseline and Band-Aid daily until healed.  Prior to procedure, discussed risks of blister formation, small wound, skin dyspigmentation, or rare scar following cryotherapy.    Wound Care Instructions  1. Cleanse wound gently with soap and water once a day then pat dry with clean gauze. Apply a thing coat of Petrolatum (petroleum jelly, "Vaseline") over the wound (unless you have an allergy to this). We recommend that you use a new, sterile tube of Vaseline. Do not pick or remove scabs. Do not remove the yellow or white "healing tissue" from the base of the wound.  2. Cover the wound with fresh, clean, nonstick gauze and secure with paper tape. You may use Band-Aids in place of gauze and tape if the would is small enough, but would recommend trimming much of the tape off as there is often too much. Sometimes Band-Aids can irritate the skin.  3. You should call the office for your biopsy report after 1 week if you have not already been contacted.  4. If you experience any problems, such as abnormal amounts of bleeding, swelling, significant bruising, significant pain, or evidence of infection, please call the office immediately.  5. FOR ADULT SURGERY PATIENTS: If you need something for pain relief you may take 1 extra strength Tylenol (acetaminophen) AND 2 Ibuprofen (200mg each) together every 4 hours as needed for pain. (do not take these if you are allergic to them or if you have a reason you should not take them.) Typically, you may only need pain medication for 1 to 3 days.     

## 2020-07-27 NOTE — Progress Notes (Signed)
New Patient Visit  Subjective  Jacqueline Arnold is a 64 y.o. female who presents for the following: Dermatitis (New patient referred for dermatitis at face and hands.).  Patient advises spots at her face are starting to hurt and sting. They have been there for many years but have started to grow and are irritating. She also has places at hands that are starting to itch and have been present for many years, gets worse in winter. Patient has used vaseline, lotions and creams as well as a prescription that was given to her a few years ago but made her hands look worse. No draining from spots at hands.   The following portions of the chart were reviewed this encounter and updated as appropriate:  Tobacco  Allergies  Meds  Problems  Med Hx  Surg Hx  Fam Hx      Review of Systems:  No other skin or systemic complaints except as noted in HPI or Assessment and Plan.  Objective  Well appearing patient in no apparent distress; mood and affect are within normal limits.  A focused examination was performed including face, scalp, ears, lips, eyelids, arms, hands, fingernails, feet, toenails, lower legs. Relevant physical exam findings are noted in the Assessment and Plan.  Objective  R Cheek x 1, R zygoma medial x 1 (2): Erythematous keratotic or waxy stuck-on papule or plaque.   Objective  Palms, hands, feet: Numerous clustered flat topped skin colored to hyperpigmented papules at MCP's and PIP's Palms, soles and lateral feet with punctate hyperkeratotic papules For biopsy - Flat-topped hyperpigmented papule   Images               Assessment & Plan  Inflamed seborrheic keratosis (2) R Cheek x 1, R zygoma medial x 1  Prior to procedure, discussed risks of blister formation, small wound, skin dyspigmentation, or rare scar following cryotherapy.  Also offered option of numbing with lidocaine followed by curettage to reduce risk of leaving a white mark but she  preferred cryotherapy today   Destruction of lesion - R Cheek x 1, R zygoma medial x 1 Complexity: simple   Destruction method: cryotherapy   Informed consent: discussed and consent obtained   Lesion destroyed using liquid nitrogen: Yes   Cryotherapy cycles:  2 Outcome: patient tolerated procedure well with no complications   Post-procedure details: wound care instructions given    Rash Palms, hands, feet  Differential diagnosis includes PPK vs Lichen Planus vs Verruca Plana vs Other  Favor a punctate palmar plantar keratoderma.  Her mother has similar lesions on her hands.  We will not attempt treatment until we have the biopsy results.  Skin / nail biopsy - Palms, hands, feet Type of biopsy: tangential   Informed consent: discussed and consent obtained   Timeout: patient name, date of birth, surgical site, and procedure verified   Patient was prepped and draped in usual sterile fashion: Area prepped with isopropyl alcohol. Anesthesia: the lesion was anesthetized in a standard fashion   Anesthetic:  1% lidocaine w/ epinephrine 1-100,000 buffered w/ 8.4% NaHCO3 Instrument used: flexible razor blade   Hemostasis achieved with: aluminum chloride   Outcome: patient tolerated procedure well   Post-procedure details: wound care instructions given   Additional details:  Mupirocin and a bandage applied  Specimen 1 - Surgical pathology Differential Diagnosis:  Check Margins: No Numerous cluster flat topped skin colored to hyperpigmented papules at MCP's and PIP's Palms, soles and lateral feet with punctate hyperkeratotic papules  For biopsy - Flat taute hyperpigmented papule R/o PPK vs Lichen Planus vs Verruca Plana vs Other   Seborrheic Keratoses - Stuck-on, waxy, tan-brown papules and plaques  - Discussed benign etiology and prognosis. - Observe - Call for any changes  Return in about 1 month (around 08/26/2020) for follow up.  Graciella Belton, RMA, am acting as scribe  for Forest Gleason, MD .  Documentation: I have reviewed the above documentation for accuracy and completeness, and I agree with the above.  Forest Gleason, MD

## 2020-08-01 ENCOUNTER — Encounter: Payer: Self-pay | Admitting: Dermatology

## 2020-08-08 ENCOUNTER — Other Ambulatory Visit: Payer: Self-pay | Admitting: Adult Health

## 2020-08-09 ENCOUNTER — Telehealth: Payer: Self-pay

## 2020-08-09 NOTE — Telephone Encounter (Signed)
Left msg for patient to call office for biopsy results. Spoke with Rosann Auerbach at [facility] and she will have pathologist section through block, JS

## 2020-08-09 NOTE — Progress Notes (Signed)
Skin , right hand HYPERKERATOTIC MATERIAL, SUPERFICIAL BIOPSY, Unable to give clear diagnosis.   "Thick scale" but could not see a deep enough area of skin to have more information.  No fungal infection.  Will ask pathology to section further through the specimen but we may need to do a repeat deeper biopsy with stitches to get enough information for a diagnosis.  MAs please request pathology to section through block and give patient update. Please also let her know that the pathology lab got the usually backed up and we apologize for the delay in getting her results.

## 2020-08-09 NOTE — Telephone Encounter (Signed)
-----   Message from Alfonso Patten, MD sent at 08/09/2020 12:16 PM EDT ----- Skin , right hand HYPERKERATOTIC MATERIAL, SUPERFICIAL BIOPSY, Unable to give clear diagnosis.   "Thick scale" but could not see a deep enough area of skin to have more information.  No fungal infection.  Will ask pathology to section further through the specimen but we may need to do a repeat deeper biopsy with stitches to get enough information for a diagnosis.  MAs please request pathology to section through block and give patient update. Please also let her know that the pathology lab got the usually backed up and we apologize for the delay in getting her results.

## 2020-08-10 NOTE — Telephone Encounter (Signed)
Thank you for the update. When we speak with patient, can schedule for repeat deeper biopsy if she would like more information and possible treatment options depending on result.

## 2020-08-10 NOTE — Telephone Encounter (Signed)
Dr. Deborah Chalk commented on lab (addendum has been added to patient's EMR) "As noted in the microscopic desscription the entire specimen was submitted for examination. There is no specimen left in the tissue block to be examined."

## 2020-08-11 ENCOUNTER — Telehealth: Payer: Self-pay

## 2020-08-11 NOTE — Telephone Encounter (Signed)
-----   Message from Virginia Moye, MD sent at 08/11/2020  5:17 PM EDT ----- Regarding: please try calling again before leaving today if possible. thanks!  ----- Message ----- From: Interface, Lab In [facility] Sent: 08/04/2020   6:18 PM EDT To: Virginia Moye, MD   

## 2020-08-11 NOTE — Telephone Encounter (Signed)
Left message for patient to call for biopsy results, JS

## 2020-08-21 ENCOUNTER — Other Ambulatory Visit: Payer: Self-pay | Admitting: Adult Health

## 2020-08-21 DIAGNOSIS — K219 Gastro-esophageal reflux disease without esophagitis: Secondary | ICD-10-CM

## 2020-08-25 ENCOUNTER — Telehealth: Payer: Self-pay

## 2020-08-25 ENCOUNTER — Other Ambulatory Visit: Payer: Self-pay

## 2020-08-25 ENCOUNTER — Ambulatory Visit: Payer: Managed Care, Other (non HMO) | Admitting: Nurse Practitioner

## 2020-08-25 ENCOUNTER — Encounter: Payer: Self-pay | Admitting: Nurse Practitioner

## 2020-08-25 VITALS — BP 151/95 | HR 98 | Temp 97.5°F | Resp 16 | Ht 64.0 in | Wt 193.0 lb

## 2020-08-25 DIAGNOSIS — I10 Essential (primary) hypertension: Secondary | ICD-10-CM

## 2020-08-25 DIAGNOSIS — Z6833 Body mass index (BMI) 33.0-33.9, adult: Secondary | ICD-10-CM

## 2020-08-25 DIAGNOSIS — F329 Major depressive disorder, single episode, unspecified: Secondary | ICD-10-CM

## 2020-08-25 DIAGNOSIS — E668 Other obesity: Secondary | ICD-10-CM

## 2020-08-25 MED ORDER — SAXENDA 18 MG/3ML ~~LOC~~ SOPN: 3.0000 mg | PEN_INJECTOR | Freq: Every day | SUBCUTANEOUS | 3 refills | Status: DC
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
## 2020-08-25 NOTE — Telephone Encounter (Signed)
-----   Message from Alfonso Patten, MD sent at 08/11/2020  5:17 PM EDT ----- Regarding: please try calling again before leaving today if possible. thanks!  ----- Message ----- From: Interface, Lab In [facility] Sent: 08/04/2020   6:18 PM EDT To: Alfonso Patten, MD

## 2020-08-25 NOTE — Telephone Encounter (Signed)
Patient refused to schedule follow up after appointment and walked out stating she will call back to schedule 3 month follow up. Jacqueline Arnold
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
## 2020-09-21 ENCOUNTER — Ambulatory Visit: Payer: Managed Care, Other (non HMO) | Admitting: Dermatology

## 2020-10-10 ENCOUNTER — Other Ambulatory Visit: Payer: Self-pay

## 2020-10-24 ENCOUNTER — Telehealth: Payer: Self-pay

## 2020-10-24 DIAGNOSIS — Z6833 Body mass index (BMI) 33.0-33.9, adult: Secondary | ICD-10-CM

## 2020-10-24 MED ORDER — SAXENDA 18 MG/3ML ~~LOC~~ SOPN: 3.0000 mg | PEN_INJECTOR | Freq: Every day | SUBCUTANEOUS | 3 refills | Status: DC

## 2020-10-24 NOTE — Telephone Encounter (Signed)
PA approved for Saxenda Inj 18 mg/51ml from 08/26/2020 to 04/21/2021

## 2020-11-17 ENCOUNTER — Ambulatory Visit: Payer: Managed Care, Other (non HMO) | Admitting: Internal Medicine

## 2020-11-17 ENCOUNTER — Other Ambulatory Visit: Payer: Self-pay

## 2020-11-17 ENCOUNTER — Encounter: Payer: Self-pay | Admitting: Internal Medicine

## 2020-11-17 DIAGNOSIS — Z1211 Encounter for screening for malignant neoplasm of colon: Secondary | ICD-10-CM | POA: Diagnosis not present

## 2020-11-17 DIAGNOSIS — E2839 Other primary ovarian failure: Secondary | ICD-10-CM

## 2020-11-17 DIAGNOSIS — F329 Major depressive disorder, single episode, unspecified: Secondary | ICD-10-CM

## 2020-11-17 DIAGNOSIS — I10 Essential (primary) hypertension: Secondary | ICD-10-CM | POA: Diagnosis not present

## 2020-11-17 DIAGNOSIS — Z0189 Encounter for other specified special examinations: Secondary | ICD-10-CM

## 2020-11-17 DIAGNOSIS — Z6833 Body mass index (BMI) 33.0-33.9, adult: Secondary | ICD-10-CM | POA: Diagnosis not present

## 2020-11-17 DIAGNOSIS — K219 Gastro-esophageal reflux disease without esophagitis: Secondary | ICD-10-CM
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
## 2020-12-07 ENCOUNTER — Other Ambulatory Visit: Payer: Self-pay | Admitting: Hospice and Palliative Medicine

## 2020-12-08 ENCOUNTER — Ambulatory Visit: Payer: Managed Care, Other (non HMO) | Admitting: Physician Assistant

## 2020-12-08 ENCOUNTER — Telehealth: Payer: Self-pay

## 2020-12-08 VITALS — BP 124/102 | HR 104 | Temp 98.3°F | Resp 16 | Ht 60.0 in | Wt 185.4 lb

## 2020-12-08 DIAGNOSIS — R112 Nausea with vomiting, unspecified: Secondary | ICD-10-CM | POA: Diagnosis not present

## 2020-12-08 DIAGNOSIS — R079 Chest pain, unspecified: Secondary | ICD-10-CM

## 2020-12-08 DIAGNOSIS — R42 Dizziness and giddiness: Secondary | ICD-10-CM | POA: Diagnosis not present

## 2020-12-08 LAB — T4, FREE: Free T4: 1.14 ng/dL (ref 0.82–1.77)

## 2020-12-08 LAB — CBC WITH DIFFERENTIAL/PLATELET
Basophils Absolute: 0.1 10*3/uL (ref 0.0–0.2)
Basos: 1 %
EOS (ABSOLUTE): 0.3 10*3/uL (ref 0.0–0.4)
Eos: 3 %
Hematocrit: 40.8 % (ref 34.0–46.6)
Hemoglobin: 13.6 g/dL (ref 11.1–15.9)
Immature Grans (Abs): 0 10*3/uL (ref 0.0–0.1)
Immature Granulocytes: 0 %
Lymphocytes Absolute: 2.7 10*3/uL (ref 0.7–3.1)
Lymphs: 37 %
MCH: 27.4 pg (ref 26.6–33.0)
MCHC: 33.3 g/dL (ref 31.5–35.7)
MCV: 82 fL (ref 79–97)
Monocytes Absolute: 0.6 10*3/uL (ref 0.1–0.9)
Monocytes: 8 %
Neutrophils Absolute: 3.7 10*3/uL (ref 1.4–7.0)
Neutrophils: 51 %
Platelets: 345 10*3/uL (ref 150–450)
RBC: 4.97 x10E6/uL (ref 3.77–5.28)
RDW: 14.4 % (ref 11.7–15.4)
WBC: 7.3 10*3/uL (ref 3.4–10.8)

## 2020-12-08 LAB — COMPREHENSIVE METABOLIC PANEL
ALT: 16 IU/L (ref 0–32)
AST: 24 IU/L (ref 0–40)
Albumin/Globulin Ratio: 1.9 (ref 1.2–2.2)
Albumin: 4.5 g/dL (ref 3.8–4.8)
Alkaline Phosphatase: 114 IU/L (ref 44–121)
BUN/Creatinine Ratio: 12 (ref 12–28)
BUN: 11 mg/dL (ref 8–27)
Bilirubin Total: <0.2 mg/dL (ref 0.0–1.2)
CO2: 21 mmol/L (ref 20–29)
Calcium: 9.6 mg/dL (ref 8.7–10.3)
Chloride: 105 mmol/L (ref 96–106)
Creatinine, Ser: 0.89 mg/dL (ref 0.57–1.00)
GFR calc Af Amer: 80 mL/min/{1.73_m2} (ref >59)
GFR calc non Af Amer: 70 mL/min/{1.73_m2} (ref >59)
Globulin, Total: 2.4 g/dL (ref 1.5–4.5)
Glucose: 106 mg/dL — ABNORMAL HIGH (ref 65–99)
Potassium: 4 mmol/L (ref 3.5–5.2)
Sodium: 142 mmol/L (ref 134–144)
Total Protein: 6.9 g/dL (ref 6.0–8.5)

## 2020-12-08 LAB — LIPID PANEL WITH LDL/HDL RATIO
Cholesterol, Total: 203 mg/dL — ABNORMAL HIGH (ref 100–199)
HDL: 73 mg/dL (ref >39)
LDL Chol Calc (NIH): 117 mg/dL — ABNORMAL HIGH (ref 0–99)
LDL/HDL Ratio: 1.6 ratio (ref 0.0–3.2)
Triglycerides: 71 mg/dL (ref 0–149)
VLDL Cholesterol Cal: 13 mg/dL (ref 5–40)

## 2020-12-08 LAB — TSH: TSH: 1.7 u[IU]/mL (ref 0.450–4.500)

## 2020-12-08 MED ORDER — ONDANSETRON HCL 4 MG PO TABS: 4.0000 mg | ORAL_TABLET | Freq: Three times a day (TID) | ORAL | 0 refills | Status: DC | PRN
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
## 2020-12-08 NOTE — Telephone Encounter (Signed)
Left message asked pt to call back and schedule appointment for this morning with dr Humphrey Rolls. Sie Formisano

## 2020-12-15 ENCOUNTER — Ambulatory Visit: Payer: Managed Care, Other (non HMO) | Admitting: Physician Assistant

## 2020-12-15 ENCOUNTER — Ambulatory Visit: Payer: Managed Care, Other (non HMO)

## 2020-12-22 ENCOUNTER — Other Ambulatory Visit: Payer: Self-pay

## 2020-12-22 ENCOUNTER — Ambulatory Visit: Payer: Managed Care, Other (non HMO) | Admitting: Physician Assistant

## 2020-12-22 ENCOUNTER — Encounter: Payer: Self-pay | Admitting: Physician Assistant

## 2020-12-22 DIAGNOSIS — E6609 Other obesity due to excess calories: Secondary | ICD-10-CM

## 2020-12-22 DIAGNOSIS — R112 Nausea with vomiting, unspecified: Secondary | ICD-10-CM

## 2020-12-22 DIAGNOSIS — I10 Essential (primary) hypertension: Secondary | ICD-10-CM

## 2020-12-22 DIAGNOSIS — Z6831 Body mass index (BMI) 31.0-31.9, adult: Secondary | ICD-10-CM

## 2020-12-22 DIAGNOSIS — K219 Gastro-esophageal reflux disease without esophagitis: Secondary | ICD-10-CM | POA: Diagnosis not present

## 2020-12-22 DIAGNOSIS — R42 Dizziness and giddiness: Secondary | ICD-10-CM

## 2020-12-22 DIAGNOSIS — E7800 Pure hypercholesterolemia, unspecified: Secondary | ICD-10-CM

## 2020-12-22 MED ORDER — ATORVASTATIN CALCIUM 10 MG PO TABS: 10.0000 mg | ORAL_TABLET | Freq: Every day | ORAL | 2 refills | Status: DC
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
## 2021-01-09 ENCOUNTER — Encounter: Payer: Self-pay | Admitting: Gastroenterology

## 2021-01-09 ENCOUNTER — Other Ambulatory Visit: Payer: Self-pay

## 2021-01-09 ENCOUNTER — Ambulatory Visit (INDEPENDENT_AMBULATORY_CARE_PROVIDER_SITE_OTHER): Payer: Managed Care, Other (non HMO) | Admitting: Gastroenterology

## 2021-01-09 VITALS — BP 116/79 | HR 64 | Temp 97.2°F | Ht 64.0 in | Wt 185.0 lb

## 2021-01-09 DIAGNOSIS — K219 Gastro-esophageal reflux disease without esophagitis: Secondary | ICD-10-CM | POA: Diagnosis not present

## 2021-01-09 DIAGNOSIS — R112 Nausea with vomiting, unspecified: Secondary | ICD-10-CM

## 2021-01-09 DIAGNOSIS — K5900 Constipation, unspecified: Secondary | ICD-10-CM | POA: Diagnosis not present

## 2021-01-09 DIAGNOSIS — K22719 Barrett's esophagus with dysplasia, unspecified: Secondary | ICD-10-CM

## 2021-01-09 DIAGNOSIS — Z1211 Encounter for screening for malignant neoplasm of colon: Secondary | ICD-10-CM

## 2021-01-09 DIAGNOSIS — K209 Esophagitis, unspecified without bleeding: Secondary | ICD-10-CM

## 2021-01-09 DIAGNOSIS — K2270 Barrett's esophagus without dysplasia: Secondary | ICD-10-CM

## 2021-01-09 MED ORDER — NA SULFATE-K SULFATE-MG SULF 17.5-3.13-1.6 GM/177ML PO SOLN: ORAL | 0 refills | Status: DC
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
## 2021-01-17 ENCOUNTER — Other Ambulatory Visit
Admission: RE | Admit: 2021-01-17 | Discharge: 2021-01-17 | Disposition: A | Discharge status: Home / Self Care | Payer: Managed Care, Other (non HMO) | Source: Ambulatory Visit | Attending: Gastroenterology | Admitting: Gastroenterology

## 2021-01-17 ENCOUNTER — Other Ambulatory Visit: Payer: Self-pay

## 2021-01-17 DIAGNOSIS — Z20822 Contact with and (suspected) exposure to covid-19: Secondary | ICD-10-CM | POA: Diagnosis not present

## 2021-01-17 DIAGNOSIS — Z01812 Encounter for preprocedural laboratory examination: Secondary | ICD-10-CM | POA: Insufficient documentation

## 2021-01-17 LAB — SARS CORONAVIRUS 2 (TAT 6-24 HRS): SARS Coronavirus 2: NEGATIVE

## 2021-01-18 ENCOUNTER — Encounter: Payer: Self-pay | Admitting: Gastroenterology

## 2021-01-19 ENCOUNTER — Other Ambulatory Visit: Payer: Self-pay

## 2021-01-19 ENCOUNTER — Ambulatory Visit: Payer: Managed Care, Other (non HMO) | Admitting: Anesthesiology

## 2021-01-19 ENCOUNTER — Encounter: Payer: Self-pay | Admitting: Gastroenterology

## 2021-01-19 ENCOUNTER — Ambulatory Visit
Admission: RE | Admit: 2021-01-19 | Discharge: 2021-01-19 | Disposition: A | Discharge status: Home / Self Care | Payer: Managed Care, Other (non HMO) | Attending: Gastroenterology | Admitting: Gastroenterology

## 2021-01-19 ENCOUNTER — Encounter
Admission: RE | Disposition: A | Discharge status: Home / Self Care | Payer: Self-pay | Source: Home / Self Care | Attending: Gastroenterology

## 2021-01-19 DIAGNOSIS — K2270 Barrett's esophagus without dysplasia: Secondary | ICD-10-CM | POA: Insufficient documentation

## 2021-01-19 DIAGNOSIS — Z13810 Encounter for screening for upper gastrointestinal disorder: Secondary | ICD-10-CM | POA: Diagnosis not present

## 2021-01-19 DIAGNOSIS — Z791 Long term (current) use of non-steroidal anti-inflammatories (NSAID): Secondary | ICD-10-CM | POA: Insufficient documentation

## 2021-01-19 DIAGNOSIS — Z1211 Encounter for screening for malignant neoplasm of colon: Secondary | ICD-10-CM

## 2021-01-19 DIAGNOSIS — Z79899 Other long term (current) drug therapy: Secondary | ICD-10-CM | POA: Diagnosis not present

## 2021-01-19 DIAGNOSIS — K2100 Gastro-esophageal reflux disease with esophagitis, without bleeding: Secondary | ICD-10-CM | POA: Diagnosis not present

## 2021-01-19 DIAGNOSIS — K219 Gastro-esophageal reflux disease without esophagitis: Secondary | ICD-10-CM

## 2021-01-19 DIAGNOSIS — K3189 Other diseases of stomach and duodenum: Secondary | ICD-10-CM

## 2021-01-19 DIAGNOSIS — R12 Heartburn: Secondary | ICD-10-CM

## 2021-01-19 DIAGNOSIS — K449 Diaphragmatic hernia without obstruction or gangrene: Secondary | ICD-10-CM | POA: Diagnosis not present

## 2021-01-19 DIAGNOSIS — K209 Esophagitis, unspecified without bleeding: Secondary | ICD-10-CM

## 2021-01-19 DIAGNOSIS — R112 Nausea with vomiting, unspecified: Secondary | ICD-10-CM

## 2021-01-19 DIAGNOSIS — Z8249 Family history of ischemic heart disease and other diseases of the circulatory system: Secondary | ICD-10-CM | POA: Diagnosis not present

## 2021-01-19 HISTORY — PX: COLONOSCOPY WITH PROPOFOL: SHX5780

## 2021-01-19 HISTORY — PX: ESOPHAGOGASTRODUODENOSCOPY (EGD) WITH PROPOFOL: SHX5813

## 2021-01-19 SURGERY — COLONOSCOPY WITH PROPOFOL
Anesthesia: General

## 2021-01-19 MED ORDER — PROPOFOL 10 MG/ML IV BOLUS
INTRAVENOUS | Status: AC
  Filled 2021-01-19: qty 20

## 2021-01-19 MED ORDER — PROPOFOL 500 MG/50ML IV EMUL
INTRAVENOUS | Status: DC | PRN
  Administered 2021-01-19: 120 ug/kg/min via INTRAVENOUS

## 2021-01-19 MED ORDER — SODIUM CHLORIDE 0.9 % IV SOLN: INTRAVENOUS | Status: DC

## 2021-01-19 MED ORDER — LIDOCAINE HCL (PF) 2 % IJ SOLN
INTRAMUSCULAR | Status: AC
  Filled 2021-01-19: qty 5

## 2021-01-19 MED ORDER — PROPOFOL 500 MG/50ML IV EMUL
INTRAVENOUS | Status: AC
  Filled 2021-01-19: qty 50
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
## 2021-01-19 NOTE — Op Note (Signed)
[facility] Gastroenterology Patient Name: Jacqueline Arnold Procedure Date: 01/19/2021 8:28 AM MRN: [id number] Account #: [id number] Date of Birth: 28-Aug-1956 Admit Type: Outpatient Age: 65 Room: [facility] ENDO ROOM 2 Gender: Female Note Status: Finalized Procedure:             Colonoscopy Indications:           Screening for colorectal malignant neoplasm Providers:             Reagen Haberman B. Bonna Gains MD, MD Medicines:             Monitored Anesthesia Care Complications:         No immediate complications. Procedure:             Pre-Anesthesia Assessment:                        - Prior to the procedure, a History and Physical was                         performed, and patient medications, allergies and                         sensitivities were reviewed. The patient's tolerance                         of previous anesthesia was reviewed.                        - The risks and benefits of the procedure and the                         sedation options and risks were discussed with the                         patient. All questions were answered and informed                         consent was obtained.                        - Patient identification and proposed procedure were                         verified prior to the procedure by the physician, the                         nurse, the anesthetist and the technician. The                         procedure was verified in the pre-procedure area in                         the procedure room in the endoscopy suite.                        - ASA Grade Assessment: II - A patient with mild                         systemic disease.                        -  After reviewing the risks and benefits, the patient                         was deemed in satisfactory condition to undergo the                         procedure.                        After obtaining informed consent, the colonoscope was                         passed  under direct vision. Throughout the procedure,                         the patient's blood pressure, pulse, and oxygen                         saturations were monitored continuously. The                         Colonoscope was introduced through the anus and                         advanced to the the cecum, identified by appendiceal                         orifice and ileocecal valve. The colonoscopy was                         performed with ease. The patient tolerated the                         procedure well. The quality of the bowel preparation                         was fair. Due to the presence of vegetable matter,                         repeated clogging of the suction channel occured. This                         required button exchange/clean out and and using                         suction at the biopsy por to unclog. Findings:      The perianal and digital rectal examinations were normal.      The rectum, sigmoid colon, descending colon, transverse colon, ascending       colon and cecum appeared normal.      The retroflexed view of the distal rectum and anal verge was normal and       showed no anal or rectal abnormalities. Impression:            - Preparation of the colon was fair.                        - The rectum, sigmoid colon, descending colon,  transverse colon, ascending colon and cecum are normal.                        - The distal rectum and anal verge are normal on                         retroflexion view.                        - No specimens collected. Recommendation:        - Discharge patient to home.                        - Resume previous diet.                        - Continue present medications.                        - Repeat colonoscopy in 1 year (with low fiber diet 1                         week prior) because the bowel preparation was                         suboptimal.                        - Return to primary care  physician as previously                         scheduled.                        - The findings and recommendations were discussed with                         the patient.                        - The findings and recommendations were discussed with                         the patient's family. Procedure Code(s):     --- Professional ---                        [phone], Colonoscopy, flexible; diagnostic, including                         collection of specimen(s) by brushing or washing, when                         performed (separate procedure) Diagnosis Code(s):     --- Professional ---                        Z12.11, Encounter for screening for malignant neoplasm                         of colon CPT copyright 2019 American Medical Association. All rights reserved. The codes documented in this report are  preliminary and upon coder review may  be revised to meet current compliance requirements.  [name], MD Margretta Sidle B. Bonna Gains MD, MD 01/19/2021 9:49:29 AM This report has been signed electronically. Number of Addenda: 0 Note Initiated On: 01/19/2021 8:28 AM Scope Withdrawal Time: 0 hours 14 minutes 29 seconds  Total Procedure Duration: 0 hours 18 minutes 49 seconds       [facility]

## 2021-01-19 NOTE — Transfer of Care (Signed)
Immediate Anesthesia Transfer of Care Note  Patient: Jacqueline Arnold  Procedure(s) Performed: COLONOSCOPY WITH PROPOFOL (N/A ) ESOPHAGOGASTRODUODENOSCOPY (EGD) WITH PROPOFOL (N/A )  Patient Location: PACU  Anesthesia Type:General  Level of Consciousness: awake and sedated  Airway & Oxygen Therapy: Patient Spontanous Breathing and Patient connected to nasal cannula oxygen  Post-op Assessment: Report given to RN and Post -op Vital signs reviewed and stable  Post vital signs: Reviewed and stable  Last Vitals:  Vitals Value Taken Time  BP    Temp    Pulse    Resp    SpO2      Last Pain:  Vitals:   01/19/21 0805  TempSrc: Temporal  PainSc: 0-No pain         Complications: No complications documented.

## 2021-01-19 NOTE — Op Note (Signed)
[facility] Gastroenterology Patient Name: Kilee Hedding Procedure Date: 01/19/2021 8:29 AM MRN: [id number] Account #: [id number] Date of Birth: 01/24/1956 Admit Type: Outpatient Age: 65 Room: [facility] ENDO ROOM 2 Gender: Female Note Status: Finalized Procedure:             Upper GI endoscopy Indications:           Abdominal pain, Heartburn, Screening for Barrett's                         esophagus Providers:             Dabney Schanz B. Bonna Gains MD, MD Referring MD:          Lavera Guise, MD (Referring MD) Medicines:             Monitored Anesthesia Care Complications:         No immediate complications. Procedure:             Pre-Anesthesia Assessment:                        - Prior to the procedure, a History and Physical was                         performed, and patient medications, allergies and                         sensitivities were reviewed. The patient's tolerance                         of previous anesthesia was reviewed.                        - The risks and benefits of the procedure and the                         sedation options and risks were discussed with the                         patient. All questions were answered and informed                         consent was obtained.                        - Patient identification and proposed procedure were                         verified prior to the procedure by the physician, the                         nurse, the anesthesiologist, the anesthetist and the                         technician. The procedure was verified in the                         procedure room.                        - ASA Grade Assessment: II - A  patient with mild                         systemic disease.                        After obtaining informed consent, the endoscope was                         passed under direct vision. Throughout the procedure,                         the patient's blood pressure, pulse, and  oxygen                         saturations were monitored continuously. The Endoscope                         was introduced through the mouth, and advanced to the                         second part of duodenum. The upper GI endoscopy was                         accomplished with ease. The patient tolerated the                         procedure well. Findings:      Tongues of salmon-colored mucosa were present. No other visible       abnormalities were present. Mucosa was biopsied with a cold forceps for       histology.      The exam of the esophagus was otherwise normal.      A 2 cm hiatal hernia was present.      Evidence of an anti-reflux surgical site was found in the gastric body.       This was characterized by a non-intact appearance and visible sutures.      The exam of the stomach was otherwise normal.      The entire examined stomach was normal. Biopsies were obtained in the       gastric body, at the incisura and in the gastric antrum with cold       forceps for histology. Biopsies were taken with a cold forceps for       Helicobacter pylori testing.      A single 5 mm mucosal nodule was found in the duodenal bulb. Biopsies       were taken with a cold forceps for histology.      The exam of the duodenum was otherwise normal.      The duodenal bulb, second portion of the duodenum and examined duodenum       were normal. Impression:            - Salmon-colored mucosa suspicious for short-segment                         Barrett's esophagus. Biopsied.                        - 2 cm hiatal hernia.                        -  An anti-reflux surgical site was found,                         characterized by visible sutures and a non-intact                         appearance.                        - Normal stomach. Biopsied.                        - Mucosal nodule found in the duodenum. Biopsied.                        - Normal duodenal bulb, second portion of the duodenum                          and examined duodenum.                        - Biopsies were obtained in the gastric body, at the                         incisura and in the gastric antrum. Recommendation:        - Refer to surgeon to discuss previous surgery sutures                         seen in the stomach                        - Return to my office in 4 weeks.                        - Await pathology results.                        - Discharge patient to home (with escort).                        - Advance diet as tolerated.                        - Continue present medications.                        - Patient has a contact number available for                         emergencies. The signs and symptoms of potential                         delayed complications were discussed with the patient.                         Return to normal activities tomorrow. Written                         discharge instructions were provided to the patient.                        -  Discharge patient to home (with escort).                        - The findings and recommendations were discussed with                         the patient.                        - The findings and recommendations were discussed with                         the patient's family. Procedure Code(s):     --- Professional ---                        [id number], Esophagogastroduodenoscopy, flexible,                         transoral; with biopsy, single or multiple Diagnosis Code(s):     --- Professional ---                        K22.8, Other specified diseases of esophagus                        K44.9, Diaphragmatic hernia without obstruction or                         gangrene                        Z98.890, Other specified postprocedural states                        K31.89, Other diseases of stomach and duodenum                        R10.9, Unspecified abdominal pain                        R12, Heartburn                        Z13.810,  Encounter for screening for upper                         gastrointestinal disorder CPT copyright 2019 American Medical Association. All rights reserved. The codes documented in this report are preliminary and upon coder review may  be revised to meet current compliance requirements.  [name], MD Margretta Sidle B. Bonna Gains MD, MD 01/19/2021 9:11:44 AM This report has been signed electronically. Number of Addenda: 0 Note Initiated On: 01/19/2021 8:29 AM Estimated Blood Loss:  Estimated blood loss: none.      [facility]

## 2021-01-19 NOTE — Anesthesia Procedure Notes (Signed)
Performed by: Cook-Martin, Dollie Bressi Pre-anesthesia Checklist: Patient identified, Emergency Drugs available, Suction available, Patient being monitored and Timeout performed Patient Re-evaluated:Patient Re-evaluated prior to induction Oxygen Delivery Method: Nasal cannula Preoxygenation: Pre-oxygenation with 100% oxygen Induction Type: IV induction Airway Equipment and Method: Bite block Placement Confirmation: positive ETCO2 and CO2 detector       

## 2021-01-19 NOTE — Anesthesia Preprocedure Evaluation (Signed)
Anesthesia Evaluation  Patient identified by MRN, date of birth, ID band Patient awake    Reviewed: Allergy & Precautions, H&P , NPO status , Patient's Chart, lab work & pertinent test results, reviewed documented beta blocker date and time   Airway Mallampati: II  TM Distance: >3 FB Neck ROM: full    Dental no notable dental hx.    Pulmonary asthma , sleep apnea and Continuous Positive Airway Pressure Ventilation ,    Pulmonary exam normal breath sounds clear to auscultation       Cardiovascular Exercise Tolerance: Good hypertension, On Medications  Rhythm:regular Rate:Normal     Neuro/Psych  Headaches, PSYCHIATRIC DISORDERS Anxiety Depression    GI/Hepatic Neg liver ROS, hiatal hernia, GERD  ,  Endo/Other  negative endocrine ROS  Renal/GU negative Renal ROS  negative genitourinary   Musculoskeletal   Abdominal   Peds negative pediatric ROS (+)  Hematology negative hematology ROS (+)   Anesthesia Other Findings   Reproductive/Obstetrics negative OB ROS                             Anesthesia Physical  Anesthesia Plan  ASA: II  Anesthesia Plan: General   Post-op Pain Management:    Induction:   PONV Risk Score and Plan: TIVA and Propofol infusion  Airway Management Planned: Nasal Cannula  Additional Equipment:   Intra-op Plan:   Post-operative Plan:   Informed Consent: I have reviewed the patients History and Physical, chart, labs and discussed the procedure including the risks, benefits and alternatives for the proposed anesthesia with the patient or authorized representative who has indicated his/her understanding and acceptance.       Plan Discussed with: CRNA  Anesthesia Plan Comments:         Anesthesia Quick Evaluation

## 2021-01-19 NOTE — Anesthesia Postprocedure Evaluation (Signed)
Anesthesia Post Note  Patient: Jacqueline Arnold  Procedure(s) Performed: COLONOSCOPY WITH PROPOFOL (N/A ) ESOPHAGOGASTRODUODENOSCOPY (EGD) WITH PROPOFOL (N/A )  Patient location during evaluation: Endoscopy Anesthesia Type: General Level of consciousness: awake and alert and oriented Pain management: pain level controlled Vital Signs Assessment: post-procedure vital signs reviewed and stable Respiratory status: spontaneous breathing Cardiovascular status: blood pressure returned to baseline Anesthetic complications: no   No complications documented.   Last Vitals:  Vitals:   01/19/21 0955 01/19/21 0958  BP:  (!) 117/98  Pulse: (!) 110 86  Resp: (!) 22 18  Temp:    SpO2: 100% 100%    Last Pain:  Vitals:   01/19/21 0958  TempSrc:   PainSc: 0-No pain                 Rosalba Totty

## 2021-01-20 ENCOUNTER — Telehealth: Payer: Self-pay

## 2021-01-20 ENCOUNTER — Encounter: Payer: Self-pay | Admitting: Gastroenterology

## 2021-01-20 DIAGNOSIS — K449 Diaphragmatic hernia without obstruction or gangrene: Secondary | ICD-10-CM

## 2021-01-20 NOTE — Telephone Encounter (Signed)
-----   Message from Virgel Manifold, MD sent at 01/19/2021 10:57 AM EDT ----- Please refer to   Carmelina Noun, MD  [address]  [place], [place] [address]  [phone] (Work)  [phone] (Fax)   This is her surgeon who did her hiatal hernia repair. Indication for referral: "Non-intact hiatal hernia and previous suture/fasteners seen in stomach".   Please make sure pt has clinic follow up in 4-6 weeks scheduled

## 2021-01-20 NOTE — Telephone Encounter (Signed)
Referral was sent to Carmelina Noun, MD. They will contact the patient with date and time.  Carmelina Noun, MD  [address]  [place], [place] [address]  [phone] (Work)  [phone] (Fax)

## 2021-01-24 LAB — SURGICAL PATHOLOGY

## 2021-02-03 ENCOUNTER — Other Ambulatory Visit: Payer: Self-pay

## 2021-02-03 DIAGNOSIS — F329 Major depressive disorder, single episode, unspecified: Secondary | ICD-10-CM

## 2021-02-03 MED ORDER — VENLAFAXINE HCL ER 150 MG PO CP24: 150.0000 mg | ORAL_CAPSULE | Freq: Every day | ORAL | 0 refills | Status: DC

## 2021-02-09 ENCOUNTER — Encounter: Payer: Self-pay | Admitting: Emergency Medicine

## 2021-02-09 ENCOUNTER — Other Ambulatory Visit: Payer: Self-pay

## 2021-02-09 ENCOUNTER — Ambulatory Visit
Admission: EM | Admit: 2021-02-09 | Discharge: 2021-02-09 | Disposition: A | Discharge status: Home / Self Care | Payer: Managed Care, Other (non HMO) | Attending: Physician Assistant | Admitting: Physician Assistant

## 2021-02-09 DIAGNOSIS — R1010 Upper abdominal pain, unspecified: Secondary | ICD-10-CM | POA: Insufficient documentation

## 2021-02-09 DIAGNOSIS — K449 Diaphragmatic hernia without obstruction or gangrene: Secondary | ICD-10-CM | POA: Diagnosis present

## 2021-02-09 DIAGNOSIS — K2900 Acute gastritis without bleeding: Secondary | ICD-10-CM | POA: Insufficient documentation

## 2021-02-09 DIAGNOSIS — K219 Gastro-esophageal reflux disease without esophagitis: Secondary | ICD-10-CM | POA: Diagnosis not present

## 2021-02-09 DIAGNOSIS — R112 Nausea with vomiting, unspecified: Secondary | ICD-10-CM | POA: Insufficient documentation

## 2021-02-09 LAB — BASIC METABOLIC PANEL
Anion gap: 4 — ABNORMAL LOW (ref 5–15)
BUN: 15 mg/dL (ref 8–23)
CO2: 26 mmol/L (ref 22–32)
Calcium: 9.1 mg/dL (ref 8.9–10.3)
Chloride: 106 mmol/L (ref 98–111)
Creatinine, Ser: 0.84 mg/dL (ref 0.44–1.00)
GFR, Estimated: >60 mL/min (ref >60)
Glucose, Bld: 121 mg/dL — ABNORMAL HIGH (ref 70–99)
Potassium: 3.6 mmol/L (ref 3.5–5.1)
Sodium: 136 mmol/L (ref 135–145)

## 2021-02-09 LAB — CBC WITH DIFFERENTIAL/PLATELET
Abs Immature Granulocytes: 0.02 10*3/uL (ref 0.00–0.07)
Basophils Absolute: 0.1 10*3/uL (ref 0.0–0.1)
Basophils Relative: 1 %
Eosinophils Absolute: 0.3 10*3/uL (ref 0.0–0.5)
Eosinophils Relative: 4 %
HCT: 41.2 % (ref 36.0–46.0)
Hemoglobin: 13.4 g/dL (ref 12.0–15.0)
Immature Granulocytes: 0 %
Lymphocytes Relative: 27 %
Lymphs Abs: 1.9 10*3/uL (ref 0.7–4.0)
MCH: 26.3 pg (ref 26.0–34.0)
MCHC: 32.5 g/dL (ref 30.0–36.0)
MCV: 80.9 fL (ref 80.0–100.0)
Monocytes Absolute: 0.4 10*3/uL (ref 0.1–1.0)
Monocytes Relative: 6 %
Neutro Abs: 4.6 10*3/uL (ref 1.7–7.7)
Neutrophils Relative %: 62 %
Platelets: 385 10*3/uL (ref 150–400)
RBC: 5.09 MIL/uL (ref 3.87–5.11)
RDW: 15.5 % (ref 11.5–15.5)
WBC: 7.3 10*3/uL (ref 4.0–10.5)
nRBC: 0 % (ref 0.0–0.2)

## 2021-02-09 LAB — LIPASE, BLOOD: Lipase: 26 U/L (ref 11–51)

## 2021-02-09 MED ORDER — SUCRALFATE 1 GM/10ML PO SUSP: 1.0000 g | Freq: Three times a day (TID) | ORAL | 0 refills | Status: DC

## 2021-02-09 MED ORDER — ONDANSETRON 4 MG PO TBDP: 4.0000 mg | ORAL_TABLET | Freq: Four times a day (QID) | ORAL | 0 refills | Status: AC | PRN
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
## 2021-02-09 NOTE — Discharge Instructions (Addendum)
Abdominal pain likely related to your reflux and hiatal hernia.  Continue with your omeprazole.  Have also sent Carafate to the pharmacy for you to take as directed as needed for reflux.  Eat smaller meals and avoid trigger foods.  Make sure you are staying hydrated.  Tylenol for any pain.  I have also sent Zofran for the nausea and vomiting.  We have performed some labs today and I will call you if there is any abnormality.  ABDOMINAL PAIN: You may take Tylenol for pain relief. Use medications as directed including antiemetics and antidiarrheal medications if suggested or prescribed. You should increase fluids and electrolytes as well as rest over these next several days. If you have any questions or concerns, or if your symptoms are not improving or if especially if they acutely worsen, please call or stop back to the clinic immediately and we will be happy to help you or go to the ER   ABDOMINAL PAIN RED FLAGS: Seek immediate further care if: symptoms remain the same or worsen over the next 3-7 days, you are unable to keep fluids down, you see blood or mucus in your stool, you vomit black or dark red material, you have a fever of 101.F or higher, you have localized and/or persistent abdominal pain     *Review of your records shows a referral placed to your surgeon who performed the hiatal hernia repair. This is the contact info if you have not received a call in a week.   Carmelina Noun, MD   [address]   [place], [place] [address]   [phone] (Work)   [phone] (Fax)

## 2021-02-09 NOTE — ED Triage Notes (Signed)
Pt c/o epigastric pain, and vomiting. She states it started this morning. She states she feels a burning sensation in her upper stomach. Denies chest pain. She take omeprazole for known GERD

## 2021-02-21 ENCOUNTER — Other Ambulatory Visit: Payer: Self-pay

## 2021-02-21 DIAGNOSIS — K449 Diaphragmatic hernia without obstruction or gangrene: Secondary | ICD-10-CM

## 2021-02-21 NOTE — Progress Notes (Signed)
Referral to [place] was faxed for an evaluation of the following per Dr. Bonna Gains: previous hiatal hernia repair, nonintact during upper endoscopy, with surgical sutures/equipment seen in the stomach".  EGD and Colonoscopy done on 01/19/2021.

## 2021-03-02 ENCOUNTER — Other Ambulatory Visit: Payer: Self-pay

## 2021-03-02 DIAGNOSIS — I10 Essential (primary) hypertension: Secondary | ICD-10-CM

## 2021-03-02 MED ORDER — AMLODIPINE BESYLATE 10 MG PO TABS: ORAL_TABLET | ORAL | 3 refills | Status: DC

## 2021-03-17 ENCOUNTER — Other Ambulatory Visit: Payer: Self-pay | Admitting: Nurse Practitioner

## 2021-03-17 DIAGNOSIS — Z6833 Body mass index (BMI) 33.0-33.9, adult: Secondary | ICD-10-CM

## 2021-03-27 ENCOUNTER — Other Ambulatory Visit: Payer: Self-pay | Admitting: Physician Assistant

## 2021-03-27 DIAGNOSIS — E7800 Pure hypercholesterolemia, unspecified: Secondary | ICD-10-CM

## 2021-04-25 ENCOUNTER — Other Ambulatory Visit: Payer: Self-pay | Admitting: Physician Assistant

## 2021-04-25 DIAGNOSIS — F329 Major depressive disorder, single episode, unspecified: Secondary | ICD-10-CM

## 2021-04-26 ENCOUNTER — Ambulatory Visit: Payer: Managed Care, Other (non HMO) | Admitting: Gastroenterology

## 2021-05-25 ENCOUNTER — Encounter: Payer: Managed Care, Other (non HMO) | Admitting: Physician Assistant

## 2021-06-05 ENCOUNTER — Encounter: Payer: Managed Care, Other (non HMO) | Admitting: Physician Assistant

## 2021-06-20 ENCOUNTER — Telehealth: Payer: Self-pay | Admitting: Gastroenterology

## 2021-06-20 NOTE — Telephone Encounter (Signed)
Patient is upset as she was referred to a Psychologist, sport and exercise. He wouldn't see [facility] due to no xray sent of hiatal hernia. Please refer back and they need the result and xrays. Please follow up with patient.

## 2021-06-21 NOTE — Telephone Encounter (Signed)
Left detailed msg on VM per HIPAA  There is no such xray to send it is the endoscopy report  Let pt know that I am not sure what happened, but I will resend referral today and she should call Fri or Mon to see if they have received and are able to go ahead and schedule appt  Referral with demographics, procedure etc faxed

## 2021-06-22 ENCOUNTER — Ambulatory Visit: Payer: Managed Care, Other (non HMO) | Admitting: Gastroenterology

## 2021-06-28 ENCOUNTER — Other Ambulatory Visit: Payer: Self-pay | Admitting: Internal Medicine

## 2021-06-28 DIAGNOSIS — E7800 Pure hypercholesterolemia, unspecified: Secondary | ICD-10-CM

## 2021-07-11 ENCOUNTER — Other Ambulatory Visit: Payer: Self-pay | Admitting: Internal Medicine

## 2021-07-17 ENCOUNTER — Other Ambulatory Visit: Payer: Self-pay | Admitting: Internal Medicine

## 2021-07-17 DIAGNOSIS — Z1231 Encounter for screening mammogram for malignant neoplasm of breast: Secondary | ICD-10-CM

## 2021-07-21 ENCOUNTER — Encounter: Payer: Self-pay | Admitting: Physician Assistant

## 2021-07-21 ENCOUNTER — Ambulatory Visit: Payer: Managed Care, Other (non HMO) | Admitting: Physician Assistant

## 2021-07-21 ENCOUNTER — Other Ambulatory Visit: Payer: Self-pay

## 2021-07-21 DIAGNOSIS — R5383 Other fatigue: Secondary | ICD-10-CM

## 2021-07-21 DIAGNOSIS — K449 Diaphragmatic hernia without obstruction or gangrene: Secondary | ICD-10-CM

## 2021-07-21 DIAGNOSIS — Z01419 Encounter for gynecological examination (general) (routine) without abnormal findings: Secondary | ICD-10-CM

## 2021-07-21 DIAGNOSIS — E2839 Other primary ovarian failure: Secondary | ICD-10-CM | POA: Diagnosis not present

## 2021-07-21 DIAGNOSIS — I10 Essential (primary) hypertension: Secondary | ICD-10-CM | POA: Diagnosis not present

## 2021-07-21 DIAGNOSIS — Z0001 Encounter for general adult medical examination with abnormal findings: Secondary | ICD-10-CM | POA: Diagnosis not present

## 2021-07-21 DIAGNOSIS — B9689 Other specified bacterial agents as the cause of diseases classified elsewhere: Secondary | ICD-10-CM

## 2021-07-21 DIAGNOSIS — N950 Postmenopausal bleeding: Secondary | ICD-10-CM

## 2021-07-21 DIAGNOSIS — E7800 Pure hypercholesterolemia, unspecified: Secondary | ICD-10-CM

## 2021-07-21 DIAGNOSIS — N760 Acute vaginitis: Secondary | ICD-10-CM

## 2021-07-21 DIAGNOSIS — K219 Gastro-esophageal reflux disease without esophagitis: Secondary | ICD-10-CM

## 2021-07-21 MED ORDER — OMEPRAZOLE 40 MG PO CPDR: 40.0000 mg | DELAYED_RELEASE_CAPSULE | Freq: Every day | ORAL | 3 refills | Status: DC

## 2021-07-21 MED ORDER — AMLODIPINE BESYLATE 10 MG PO TABS: ORAL_TABLET | ORAL | 3 refills | Status: DC

## 2021-07-21 MED ORDER — ATORVASTATIN CALCIUM 10 MG PO TABS: 10.0000 mg | ORAL_TABLET | Freq: Every day | ORAL | 1 refills | Status: DC

## 2021-07-21 MED ORDER — METRONIDAZOLE 500 MG PO TABS: 500.0000 mg | ORAL_TABLET | Freq: Two times a day (BID) | ORAL | 0 refills | Status: DC
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
## 2021-07-25 ENCOUNTER — Telehealth: Payer: Self-pay

## 2021-07-25 NOTE — Telephone Encounter (Signed)
LMOM for pt to return call to see if she is still using [place].  Asked pt to call back to let us know

## 2021-07-27 ENCOUNTER — Other Ambulatory Visit: Payer: Self-pay

## 2021-07-27 ENCOUNTER — Ambulatory Visit
Admission: RE | Admit: 2021-07-27 | Discharge: 2021-07-27 | Disposition: A | Discharge status: Home / Self Care | Payer: Managed Care, Other (non HMO) | Source: Ambulatory Visit | Attending: Internal Medicine | Admitting: Internal Medicine

## 2021-07-27 DIAGNOSIS — Z1231 Encounter for screening mammogram for malignant neoplasm of breast: Secondary | ICD-10-CM | POA: Diagnosis present

## 2021-08-02 ENCOUNTER — Other Ambulatory Visit: Payer: Self-pay

## 2021-08-02 ENCOUNTER — Ambulatory Visit
Admission: RE | Admit: 2021-08-02 | Discharge: 2021-08-02 | Disposition: A | Discharge status: Home / Self Care | Payer: Managed Care, Other (non HMO) | Source: Ambulatory Visit | Attending: Physician Assistant | Admitting: Physician Assistant

## 2021-08-02 DIAGNOSIS — N950 Postmenopausal bleeding: Secondary | ICD-10-CM | POA: Insufficient documentation

## 2021-08-10 ENCOUNTER — Other Ambulatory Visit: Payer: Self-pay

## 2021-08-10 ENCOUNTER — Ambulatory Visit
Admission: RE | Admit: 2021-08-10 | Discharge: 2021-08-10 | Disposition: A | Discharge status: Home / Self Care | Payer: Managed Care, Other (non HMO) | Source: Ambulatory Visit | Attending: Physician Assistant | Admitting: Physician Assistant

## 2021-08-10 DIAGNOSIS — E2839 Other primary ovarian failure: Secondary | ICD-10-CM | POA: Insufficient documentation

## 2021-08-22 ENCOUNTER — Telehealth: Payer: Self-pay

## 2021-08-22 NOTE — Telephone Encounter (Signed)
LMOM for pt to return call on bone density and [place] results

## 2021-08-22 NOTE — Telephone Encounter (Signed)
-----   Message from Mylinda Latina, PA-C sent at 08/22/2021  9:24 AM EDT ----- Please let pt know her bone density showed osteopenia and should supplement calcium and vit D. Also ask about her symptoms, if any vaginal bleeding still? Her pelvic US did show a small fibroid in her uterus and I can refer her to OBGYN if she has continued symptoms

## 2021-08-29 ENCOUNTER — Telehealth: Payer: Self-pay

## 2021-08-29 NOTE — Telephone Encounter (Signed)
-----   Message from Mylinda Latina, PA-C sent at 08/22/2021  9:24 AM EDT ----- Please let pt know her bone density showed osteopenia and should supplement calcium and vit D. Also ask about her symptoms, if any vaginal bleeding still? Her pelvic US did show a small fibroid in her uterus and I can refer her to OBGYN if she has continued symptoms

## 2021-08-29 NOTE — Telephone Encounter (Signed)
Spoke to pt and informed her of the bone density results.  Advised pt to supplement calcium and vit D.  Pt advised that she is still having very little bleeding but the medication is working.

## 2021-10-05 ENCOUNTER — Other Ambulatory Visit: Payer: Self-pay | Admitting: Internal Medicine

## 2021-10-05 ENCOUNTER — Telehealth: Payer: Self-pay

## 2021-10-05 NOTE — Telephone Encounter (Signed)
Please check and refill

## 2021-10-05 NOTE — Telephone Encounter (Signed)
LMOM asking pt to return my call to check if pt is still taking bupropion.  We received a request for refill from pharmacy

## 2021-10-05 NOTE — Telephone Encounter (Signed)
LMOM for pt to return call to see if still taking

## 2021-10-12 ENCOUNTER — Telehealth: Payer: Self-pay

## 2021-10-12 NOTE — Telephone Encounter (Signed)
Pt returned call and advised that she is no longer taking bupropion

## 2021-10-24 ENCOUNTER — Telehealth: Payer: Self-pay | Admitting: Gastroenterology

## 2021-10-24 NOTE — Telephone Encounter (Signed)
Inbound call from pt requesting a call back stating that she never received any results back from her procedure in March. Please advise. Thank you.

## 2021-10-27 NOTE — Telephone Encounter (Signed)
Left message on voicemail.

## 2021-11-01 NOTE — Telephone Encounter (Signed)
Left message on voicemail.

## 2021-11-01 NOTE — Telephone Encounter (Signed)
I spoke to pt and she wanted information regarding her previous EGD... answered pt's questions and copy mailed per pt request.

## 2022-01-23 ENCOUNTER — Ambulatory Visit: Payer: Managed Care, Other (non HMO) | Admitting: Internal Medicine

## 2022-01-23 ENCOUNTER — Other Ambulatory Visit: Payer: Self-pay

## 2022-01-23 ENCOUNTER — Encounter: Payer: Self-pay | Admitting: Internal Medicine

## 2022-01-23 VITALS — BP 126/86 | HR 112 | Temp 98.0°F | Resp 16 | Ht 64.0 in | Wt 172.0 lb

## 2022-01-23 DIAGNOSIS — R42 Dizziness and giddiness: Secondary | ICD-10-CM

## 2022-01-23 DIAGNOSIS — N3000 Acute cystitis without hematuria: Secondary | ICD-10-CM

## 2022-01-23 DIAGNOSIS — R300 Dysuria: Secondary | ICD-10-CM

## 2022-01-23 LAB — POCT URINALYSIS DIPSTICK
Glucose, UA: NEGATIVE
Nitrite, UA: NEGATIVE
Protein, UA: POSITIVE — AB
Spec Grav, UA: >=1.03 — AB (ref 1.010–1.025)
Urobilinogen, UA: 2 E.U./dL — AB
pH, UA: 5.5 (ref 5.0–8.0)

## 2022-01-23 MED ORDER — LEVOFLOXACIN 500 MG PO TABS: 500.0000 mg | ORAL_TABLET | Freq: Every day | ORAL | 0 refills | Status: AC
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
## 2022-01-24 ENCOUNTER — Telehealth: Payer: Self-pay

## 2022-01-24 NOTE — Telephone Encounter (Signed)
Left voicemail for patient letting her know new time for 01/30/22 appointment-Toni ?

## 2022-01-28 LAB — CULTURE, URINE COMPREHENSIVE

## 2022-01-30 ENCOUNTER — Ambulatory Visit: Payer: Managed Care, Other (non HMO) | Admitting: Internal Medicine

## 2022-02-19 ENCOUNTER — Encounter: Payer: Self-pay | Admitting: Physician Assistant

## 2022-02-19 ENCOUNTER — Ambulatory Visit: Payer: Managed Care, Other (non HMO) | Admitting: Physician Assistant

## 2022-02-19 DIAGNOSIS — Z23 Encounter for immunization: Secondary | ICD-10-CM

## 2022-02-19 DIAGNOSIS — I10 Essential (primary) hypertension: Secondary | ICD-10-CM | POA: Diagnosis not present

## 2022-02-19 DIAGNOSIS — R300 Dysuria: Secondary | ICD-10-CM | POA: Diagnosis not present

## 2022-02-19 LAB — POCT URINALYSIS DIPSTICK
Blood, UA: NEGATIVE
Glucose, UA: NEGATIVE
Nitrite, UA: NEGATIVE
Protein, UA: POSITIVE — AB
Spec Grav, UA: 1.01 (ref 1.010–1.025)
Urobilinogen, UA: 0.2 E.U./dL
pH, UA: 6 (ref 5.0–8.0)

## 2022-02-19 MED ORDER — ZOSTER VAC RECOMB ADJUVANTED 50 MCG/0.5ML IM SUSR: 0.5000 mL | Freq: Once | INTRAMUSCULAR | 0 refills | Status: AC

## 2022-02-19 MED ORDER — TETANUS-DIPHTH-ACELL PERTUSSIS 5-2.5-18.5 LF-MCG/0.5 IM SUSY: 0.5000 mL | PREFILLED_SYRINGE | Freq: Once | INTRAMUSCULAR | 0 refills | Status: AC

## 2022-02-19 MED ORDER — NITROFURANTOIN MONOHYD MACRO 100 MG PO CAPS: ORAL_CAPSULE | ORAL | 0 refills | Status: DC
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
## 2022-02-21 LAB — CULTURE, URINE COMPREHENSIVE

## 2022-03-07 ENCOUNTER — Other Ambulatory Visit: Payer: Self-pay | Admitting: Physician Assistant

## 2022-03-07 ENCOUNTER — Other Ambulatory Visit: Payer: Self-pay | Admitting: Internal Medicine

## 2022-03-07 DIAGNOSIS — E7800 Pure hypercholesterolemia, unspecified: Secondary | ICD-10-CM

## 2022-03-07 DIAGNOSIS — F329 Major depressive disorder, single episode, unspecified: Secondary | ICD-10-CM

## 2022-04-23 ENCOUNTER — Ambulatory Visit: Payer: Managed Care, Other (non HMO) | Admitting: Physician Assistant

## 2022-04-26 ENCOUNTER — Encounter: Payer: Self-pay | Admitting: Physician Assistant

## 2022-04-26 ENCOUNTER — Ambulatory Visit: Payer: Managed Care, Other (non HMO) | Admitting: Physician Assistant

## 2022-04-26 VITALS — BP 137/89 | HR 91 | Temp 97.6°F | Resp 16 | Ht 64.0 in | Wt 154.8 lb

## 2022-04-26 DIAGNOSIS — R0602 Shortness of breath: Secondary | ICD-10-CM

## 2022-04-26 DIAGNOSIS — I10 Essential (primary) hypertension: Secondary | ICD-10-CM | POA: Diagnosis not present
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
## 2022-05-14 ENCOUNTER — Telehealth: Payer: Self-pay

## 2022-05-14 NOTE — Telephone Encounter (Signed)
Left vm and sent mychart message to confirm 05/21/22 appointment-Toni

## 2022-05-21 ENCOUNTER — Ambulatory Visit: Payer: Managed Care, Other (non HMO)

## 2022-05-21 DIAGNOSIS — R0602 Shortness of breath: Secondary | ICD-10-CM

## 2022-05-21 DIAGNOSIS — I10 Essential (primary) hypertension: Secondary | ICD-10-CM

## 2022-05-31 ENCOUNTER — Encounter: Payer: Self-pay | Admitting: Physician Assistant

## 2022-05-31 ENCOUNTER — Ambulatory Visit: Payer: Managed Care, Other (non HMO) | Admitting: Physician Assistant

## 2022-05-31 VITALS — BP 132/80 | HR 90 | Temp 97.9°F | Resp 16 | Ht 64.0 in | Wt 154.0 lb

## 2022-05-31 DIAGNOSIS — I10 Essential (primary) hypertension: Secondary | ICD-10-CM | POA: Diagnosis not present
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
## 2022-06-10 ENCOUNTER — Other Ambulatory Visit: Payer: Self-pay | Admitting: Physician Assistant

## 2022-06-10 DIAGNOSIS — K219 Gastro-esophageal reflux disease without esophagitis: Secondary | ICD-10-CM

## 2022-07-06 ENCOUNTER — Encounter: Payer: Managed Care, Other (non HMO) | Admitting: Physician Assistant

## 2022-07-16 ENCOUNTER — Telehealth: Payer: Self-pay | Admitting: Physician Assistant

## 2022-07-16 NOTE — Telephone Encounter (Signed)
Left vm to confirm 07/23/22 appointment-Toni

## 2022-07-20 ENCOUNTER — Other Ambulatory Visit: Payer: Self-pay | Admitting: Physician Assistant

## 2022-07-20 DIAGNOSIS — I10 Essential (primary) hypertension: Secondary | ICD-10-CM

## 2022-07-23 ENCOUNTER — Encounter: Payer: Self-pay | Admitting: Physician Assistant

## 2022-07-23 ENCOUNTER — Ambulatory Visit (INDEPENDENT_AMBULATORY_CARE_PROVIDER_SITE_OTHER): Payer: Managed Care, Other (non HMO) | Admitting: Physician Assistant

## 2022-07-23 VITALS — BP 146/98 | HR 71 | Temp 97.6°F | Resp 16 | Ht 64.0 in | Wt 148.0 lb

## 2022-07-23 DIAGNOSIS — F329 Major depressive disorder, single episode, unspecified: Secondary | ICD-10-CM | POA: Diagnosis not present

## 2022-07-23 DIAGNOSIS — Z1231 Encounter for screening mammogram for malignant neoplasm of breast: Secondary | ICD-10-CM

## 2022-07-23 DIAGNOSIS — R946 Abnormal results of thyroid function studies: Secondary | ICD-10-CM

## 2022-07-23 DIAGNOSIS — L290 Pruritus ani: Secondary | ICD-10-CM | POA: Diagnosis not present

## 2022-07-23 DIAGNOSIS — Z0001 Encounter for general adult medical examination with abnormal findings: Secondary | ICD-10-CM | POA: Diagnosis not present

## 2022-07-23 DIAGNOSIS — E7800 Pure hypercholesterolemia, unspecified: Secondary | ICD-10-CM

## 2022-07-23 DIAGNOSIS — R300 Dysuria: Secondary | ICD-10-CM

## 2022-07-23 DIAGNOSIS — N6459 Other signs and symptoms in breast: Secondary | ICD-10-CM

## 2022-07-23 DIAGNOSIS — I10 Essential (primary) hypertension: Secondary | ICD-10-CM | POA: Diagnosis not present

## 2022-07-23 DIAGNOSIS — R5383 Other fatigue: Secondary | ICD-10-CM
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
## 2022-07-27 LAB — UA/M W/RFLX CULTURE, ROUTINE
Bilirubin, UA: NEGATIVE
Glucose, UA: NEGATIVE
Ketones, UA: NEGATIVE
Nitrite, UA: NEGATIVE
Protein,UA: NEGATIVE
RBC, UA: NEGATIVE
Specific Gravity, UA: 1.026 (ref 1.005–1.030)
Urobilinogen, Ur: 0.2 mg/dL (ref 0.2–1.0)
pH, UA: 5.5 (ref 5.0–7.5)

## 2022-07-27 LAB — MICROSCOPIC EXAMINATION
Casts: NONE SEEN /lpf
RBC, Urine: NONE SEEN /hpf (ref 0–2)

## 2022-07-27 LAB — URINE CULTURE, REFLEX

## 2022-08-07 ENCOUNTER — Other Ambulatory Visit: Payer: Self-pay | Admitting: Physician Assistant

## 2022-08-07 DIAGNOSIS — N63 Unspecified lump in unspecified breast: Secondary | ICD-10-CM

## 2022-08-09 LAB — COMPREHENSIVE METABOLIC PANEL
ALT: 18 IU/L (ref 0–32)
AST: 17 IU/L (ref 0–40)
Albumin/Globulin Ratio: 1.9 (ref 1.2–2.2)
Albumin: 4.1 g/dL (ref 3.9–4.9)
Alkaline Phosphatase: 98 IU/L (ref 44–121)
BUN/Creatinine Ratio: 13 (ref 12–28)
BUN: 11 mg/dL (ref 8–27)
Bilirubin Total: 0.3 mg/dL (ref 0.0–1.2)
CO2: 22 mmol/L (ref 20–29)
Calcium: 8.8 mg/dL (ref 8.7–10.3)
Chloride: 104 mmol/L (ref 96–106)
Creatinine, Ser: 0.88 mg/dL (ref 0.57–1.00)
Globulin, Total: 2.2 g/dL (ref 1.5–4.5)
Glucose: 88 mg/dL (ref 70–99)
Potassium: 3.9 mmol/L (ref 3.5–5.2)
Sodium: 140 mmol/L (ref 134–144)
Total Protein: 6.3 g/dL (ref 6.0–8.5)
eGFR: 73 mL/min/{1.73_m2} (ref >59)

## 2022-08-09 LAB — CBC WITH DIFFERENTIAL/PLATELET
Basophils Absolute: 0.1 10*3/uL (ref 0.0–0.2)
Basos: 1 %
EOS (ABSOLUTE): 0.3 10*3/uL (ref 0.0–0.4)
Eos: 6 %
Hematocrit: 36.9 % (ref 34.0–46.6)
Hemoglobin: 12.1 g/dL (ref 11.1–15.9)
Immature Grans (Abs): 0 10*3/uL (ref 0.0–0.1)
Immature Granulocytes: 0 %
Lymphocytes Absolute: 1.9 10*3/uL (ref 0.7–3.1)
Lymphs: 33 %
MCH: 27.9 pg (ref 26.6–33.0)
MCHC: 32.8 g/dL (ref 31.5–35.7)
MCV: 85 fL (ref 79–97)
Monocytes Absolute: 0.5 10*3/uL (ref 0.1–0.9)
Monocytes: 8 %
Neutrophils Absolute: 3.1 10*3/uL (ref 1.4–7.0)
Neutrophils: 52 %
Platelets: 276 10*3/uL (ref 150–450)
RBC: 4.34 x10E6/uL (ref 3.77–5.28)
RDW: 13.9 % (ref 11.7–15.4)
WBC: 5.8 10*3/uL (ref 3.4–10.8)

## 2022-08-09 LAB — LIPID PANEL WITH LDL/HDL RATIO
Cholesterol, Total: 149 mg/dL (ref 100–199)
HDL: 68 mg/dL (ref >39)
LDL Chol Calc (NIH): 65 mg/dL (ref 0–99)
LDL/HDL Ratio: 1 ratio (ref 0.0–3.2)
Triglycerides: 84 mg/dL (ref 0–149)
VLDL Cholesterol Cal: 16 mg/dL (ref 5–40)

## 2022-08-09 LAB — TSH+FREE T4
Free T4: 1.01 ng/dL (ref 0.82–1.77)
TSH: 1.36 u[IU]/mL (ref 0.450–4.500)

## 2022-08-15 ENCOUNTER — Telehealth: Payer: Self-pay

## 2022-08-15 NOTE — Progress Notes (Signed)
Lmom to call us back 

## 2022-08-15 NOTE — Telephone Encounter (Signed)
Pt notified for labs  

## 2022-08-15 NOTE — Telephone Encounter (Signed)
-----   Message from Mylinda Latina, PA-C sent at 08/15/2022 12:35 PM EDT ----- Please let her know that all of her labs looked good. Her cholesterol was much better.

## 2022-08-24 ENCOUNTER — Other Ambulatory Visit: Payer: Self-pay | Admitting: Physician Assistant

## 2022-08-24 DIAGNOSIS — E7800 Pure hypercholesterolemia, unspecified: Secondary | ICD-10-CM

## 2022-10-09 ENCOUNTER — Ambulatory Visit
Admission: RE | Admit: 2022-10-09 | Discharge: 2022-10-09 | Disposition: A | Discharge status: Home / Self Care | Payer: Managed Care, Other (non HMO) | Source: Ambulatory Visit | Attending: Physician Assistant | Admitting: Physician Assistant

## 2022-10-09 DIAGNOSIS — N63 Unspecified lump in unspecified breast: Secondary | ICD-10-CM | POA: Insufficient documentation

## 2022-10-09 DIAGNOSIS — Z1231 Encounter for screening mammogram for malignant neoplasm of breast: Secondary | ICD-10-CM | POA: Insufficient documentation

## 2022-10-09 DIAGNOSIS — N6459 Other signs and symptoms in breast: Secondary | ICD-10-CM | POA: Diagnosis present

## 2022-10-18 ENCOUNTER — Ambulatory Visit: Payer: Managed Care, Other (non HMO) | Admitting: Physician Assistant

## 2022-12-04 ENCOUNTER — Other Ambulatory Visit: Payer: Self-pay

## 2022-12-04 DIAGNOSIS — Z1211 Encounter for screening for malignant neoplasm of colon: Secondary | ICD-10-CM

## 2022-12-05 ENCOUNTER — Telehealth: Payer: Managed Care, Other (non HMO) | Admitting: Gastroenterology

## 2022-12-05 ENCOUNTER — Ambulatory Visit: Payer: Managed Care, Other (non HMO) | Admitting: Gastroenterology

## 2022-12-05 ENCOUNTER — Telehealth: Payer: Self-pay

## 2022-12-05 MED ORDER — CLENPIQ 10-3.5-12 MG-GM -GM/175ML PO SOLN: 1.0000 | Freq: Once | ORAL | 0 refills | Status: AC

## 2022-12-05 NOTE — Telephone Encounter (Addendum)
Pt had been scheduled for Feb 8th in [facility] for colonoscopy.. Pt is aware and requested instructions to be emailed to [email or web address]  Instructions emailed Rx sent through e-scribe

## 2022-12-10 ENCOUNTER — Encounter: Payer: Self-pay | Admitting: Gastroenterology

## 2022-12-13 ENCOUNTER — Ambulatory Visit: Payer: Managed Care, Other (non HMO) | Admitting: Anesthesiology

## 2022-12-13 ENCOUNTER — Other Ambulatory Visit: Payer: Self-pay

## 2022-12-13 ENCOUNTER — Ambulatory Visit
Admission: RE | Admit: 2022-12-13 | Discharge: 2022-12-13 | Disposition: A | Discharge status: Home / Self Care | Payer: Managed Care, Other (non HMO) | Source: Ambulatory Visit | Attending: Gastroenterology | Admitting: Gastroenterology

## 2022-12-13 ENCOUNTER — Encounter: Payer: Self-pay | Admitting: Gastroenterology

## 2022-12-13 ENCOUNTER — Encounter
Admission: RE | Disposition: A | Discharge status: Home / Self Care | Payer: Self-pay | Source: Ambulatory Visit | Attending: Gastroenterology

## 2022-12-13 DIAGNOSIS — Z1211 Encounter for screening for malignant neoplasm of colon: Secondary | ICD-10-CM | POA: Insufficient documentation

## 2022-12-13 DIAGNOSIS — K635 Polyp of colon: Secondary | ICD-10-CM | POA: Diagnosis not present

## 2022-12-13 DIAGNOSIS — I10 Essential (primary) hypertension: Secondary | ICD-10-CM | POA: Diagnosis not present

## 2022-12-13 DIAGNOSIS — G4730 Sleep apnea, unspecified: Secondary | ICD-10-CM | POA: Diagnosis not present

## 2022-12-13 DIAGNOSIS — K219 Gastro-esophageal reflux disease without esophagitis: Secondary | ICD-10-CM | POA: Diagnosis not present

## 2022-12-13 DIAGNOSIS — K640 First degree hemorrhoids: Secondary | ICD-10-CM | POA: Diagnosis not present

## 2022-12-13 DIAGNOSIS — Z8719 Personal history of other diseases of the digestive system: Secondary | ICD-10-CM | POA: Diagnosis not present

## 2022-12-13 HISTORY — PX: COLONOSCOPY WITH PROPOFOL: SHX5780

## 2022-12-13 HISTORY — PX: POLYPECTOMY: SHX5525

## 2022-12-13 HISTORY — DX: Presence of external hearing-aid: Z97.4

## 2022-12-13 SURGERY — COLONOSCOPY WITH PROPOFOL
Anesthesia: General | Site: Rectum

## 2022-12-13 MED ORDER — SODIUM CHLORIDE 0.9 % IV SOLN: INTRAVENOUS | Status: DC

## 2022-12-13 MED ORDER — STERILE WATER FOR IRRIGATION IR SOLN
Status: DC | PRN
  Administered 2022-12-13: 100 mL

## 2022-12-13 MED ORDER — PROPOFOL 10 MG/ML IV BOLUS
INTRAVENOUS | Status: DC | PRN
  Administered 2022-12-13: 50 mg via INTRAVENOUS
  Administered 2022-12-13: 100 mg via INTRAVENOUS
  Administered 2022-12-13: 50 mg via INTRAVENOUS

## 2022-12-13 MED ORDER — LACTATED RINGERS IV SOLN: INTRAVENOUS | Status: DC

## 2022-12-13 SURGICAL SUPPLY — 6 items
FORCEPS BIOP RAD 4 LRG CAP 4 (CUTTING FORCEPS) IMPLANT
GOWN CVR UNV OPN BCK APRN NK (MISCELLANEOUS) ×4 IMPLANT
GOWN ISOL THUMB LOOP REG UNIV (MISCELLANEOUS) ×4
KIT PRC NS LF DISP ENDO (KITS) ×2 IMPLANT
KIT PROCEDURE OLYMPUS (KITS) ×2
MANIFOLD NEPTUNE II (INSTRUMENTS) ×2 IMPLANT
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
## 2022-12-13 NOTE — Op Note (Signed)
[facility] Gastroenterology Patient Name: Jacqueline Arnold Procedure Date: 12/13/2022 11:21 AM MRN: [id number] Account #: [id number] Date of Birth: [birth year] Admit Type: Outpatient Age: 67 Room: [facility] OR ROOM 01 Gender: Female Note Status: Finalized Instrument Name: [id number] Procedure:             Colonoscopy Indications:           Screening for colorectal malignant neoplasm Providers:             Lucilla Lame MD, MD Medicines:             Propofol per Anesthesia Complications:         No immediate complications. Procedure:             Pre-Anesthesia Assessment:                        - Prior to the procedure, a History and Physical was                         performed, and patient medications and allergies were                         reviewed. The patient's tolerance of previous                         anesthesia was also reviewed. The risks and benefits                         of the procedure and the sedation options and risks                         were discussed with the patient. All questions were                         answered, and informed consent was obtained. Prior                         Anticoagulants: The patient has taken no anticoagulant                         or antiplatelet agents. ASA Grade Assessment: II - A                         patient with mild systemic disease. After reviewing                         the risks and benefits, the patient was deemed in                         satisfactory condition to undergo the procedure.                        After obtaining informed consent, the colonoscope was                         passed under direct vision. Throughout the procedure,                         the patient's blood pressure,  pulse, and oxygen                         saturations were monitored continuously. The                         Colonoscope was introduced through the anus and                         advanced to the the  cecum, identified by appendiceal                         orifice and ileocecal valve. The colonoscopy was                         performed without difficulty. The patient tolerated                         the procedure well. The quality of the bowel                         preparation was excellent. Findings:      The perianal and digital rectal examinations were normal.      A 3 mm polyp was found in the cecum. The polyp was sessile. The polyp       was removed with a cold biopsy forceps. Resection and retrieval were       complete.      Non-bleeding internal hemorrhoids were found during retroflexion. The       hemorrhoids were Grade I (internal hemorrhoids that do not prolapse). Impression:            - One 3 mm polyp in the cecum, removed with a cold                         biopsy forceps. Resected and retrieved.                        - Non-bleeding internal hemorrhoids. Recommendation:        - Discharge patient to home.                        - Resume previous diet.                        - Continue present medications.                        - Repeat colonoscopy in 10 years for screening                         purposes. Procedure Code(s):     --- Professional ---                        [phone], Colonoscopy, flexible; with biopsy, single or                         multiple Diagnosis Code(s):     --- Professional ---  Z12.11, Encounter for screening for malignant neoplasm                         of colon                        D12.0, Benign neoplasm of cecum CPT copyright 2022 American Medical Association. All rights reserved. The codes documented in this report are preliminary and upon coder review may  be revised to meet current compliance requirements. Lucilla Lame MD, MD 12/13/2022 11:51:43 AM This report has been signed electronically. Number of Addenda: 0 Note Initiated On: 12/13/2022 11:21 AM Scope Withdrawal Time: 0 hours 8 minutes 43 seconds  Total  Procedure Duration: 0 hours 15 minutes 13 seconds  Estimated Blood Loss:  Estimated blood loss: none.      [facility]

## 2022-12-13 NOTE — Transfer of Care (Signed)
Immediate Anesthesia Transfer of Care Note  Patient: Jacqueline Arnold  Procedure(s) Performed: COLONOSCOPY WITH BIOPSY (Rectum) POLYPECTOMY (Rectum)  Patient Location: PACU  Anesthesia Type: General  Level of Consciousness: awake, alert  and patient cooperative  Airway and Oxygen Therapy: Patient Spontanous Breathing and Patient connected to supplemental oxygen  Post-op Assessment: Post-op Vital signs reviewed, Patient's Cardiovascular Status Stable, Respiratory Function Stable, Patent Airway and No signs of Nausea or vomiting  Post-op Vital Signs: Reviewed and stable  Complications: No notable events documented.

## 2022-12-13 NOTE — Anesthesia Postprocedure Evaluation (Signed)
Anesthesia Post Note  Patient: Jacqueline Arnold  Procedure(s) Performed: COLONOSCOPY WITH BIOPSY (Rectum) POLYPECTOMY (Rectum)  Patient location during evaluation: PACU Anesthesia Type: General Level of consciousness: awake and alert Pain management: pain level controlled Vital Signs Assessment: post-procedure vital signs reviewed and stable Respiratory status: spontaneous breathing, nonlabored ventilation, respiratory function stable and patient connected to nasal cannula oxygen Cardiovascular status: blood pressure returned to baseline and stable Postop Assessment: no apparent nausea or vomiting Anesthetic complications: no   No notable events documented.   Last Vitals:  Vitals:   12/13/22 1151 12/13/22 1210  BP: (!) 102/55 (!) 119/97  Pulse: (!) 107 91  Resp:  15  Temp: (!) 36.2 C (!) 36.2 C  SpO2: 95% 100%    Last Pain:  Vitals:   12/13/22 1210  TempSrc:   PainSc: 0-No pain                 Martha Clan

## 2022-12-14 ENCOUNTER — Encounter: Payer: Self-pay | Admitting: Gastroenterology

## 2022-12-17 ENCOUNTER — Encounter: Payer: Self-pay | Admitting: Gastroenterology

## 2022-12-17 LAB — SURGICAL PATHOLOGY

## 2023-02-10 ENCOUNTER — Other Ambulatory Visit: Payer: Self-pay | Admitting: Physician Assistant

## 2023-02-10 DIAGNOSIS — F329 Major depressive disorder, single episode, unspecified: Secondary | ICD-10-CM

## 2023-05-07 ENCOUNTER — Other Ambulatory Visit: Payer: Self-pay | Admitting: Internal Medicine

## 2023-05-07 DIAGNOSIS — K219 Gastro-esophageal reflux disease without esophagitis: Secondary | ICD-10-CM

## 2023-05-10 ENCOUNTER — Telehealth: Payer: Self-pay

## 2023-05-10 NOTE — Telephone Encounter (Addendum)
Lmom  regarding pt lmom for sick appt  and LMOM several times that we can her virtual  appt no answer

## 2023-06-21 ENCOUNTER — Other Ambulatory Visit: Payer: Self-pay | Admitting: Physician Assistant

## 2023-06-21 DIAGNOSIS — I10 Essential (primary) hypertension: Secondary | ICD-10-CM

## 2023-07-01 ENCOUNTER — Encounter: Payer: Managed Care, Other (non HMO) | Admitting: Physician Assistant

## 2023-07-26 ENCOUNTER — Ambulatory Visit: Payer: Managed Care, Other (non HMO) | Admitting: Physician Assistant

## 2023-07-26 ENCOUNTER — Encounter: Payer: Self-pay | Admitting: Physician Assistant

## 2023-07-26 VITALS — BP 150/95 | HR 90 | Temp 98.4°F | Resp 16 | Ht 64.0 in | Wt 152.2 lb

## 2023-07-26 DIAGNOSIS — E7800 Pure hypercholesterolemia, unspecified: Secondary | ICD-10-CM

## 2023-07-26 DIAGNOSIS — N951 Menopausal and female climacteric states: Secondary | ICD-10-CM

## 2023-07-26 DIAGNOSIS — I10 Essential (primary) hypertension: Secondary | ICD-10-CM

## 2023-07-26 DIAGNOSIS — E782 Mixed hyperlipidemia: Secondary | ICD-10-CM

## 2023-07-26 DIAGNOSIS — F329 Major depressive disorder, single episode, unspecified: Secondary | ICD-10-CM

## 2023-07-26 DIAGNOSIS — R946 Abnormal results of thyroid function studies: Secondary | ICD-10-CM

## 2023-07-26 DIAGNOSIS — Z0001 Encounter for general adult medical examination with abnormal findings: Secondary | ICD-10-CM | POA: Diagnosis not present

## 2023-07-26 DIAGNOSIS — K219 Gastro-esophageal reflux disease without esophagitis: Secondary | ICD-10-CM | POA: Diagnosis not present

## 2023-07-26 DIAGNOSIS — R300 Dysuria: Secondary | ICD-10-CM

## 2023-07-26 DIAGNOSIS — Z1231 Encounter for screening mammogram for malignant neoplasm of breast: Secondary | ICD-10-CM

## 2023-07-26 DIAGNOSIS — R5383 Other fatigue: Secondary | ICD-10-CM

## 2023-07-26 MED ORDER — OMEPRAZOLE 40 MG PO CPDR: 40.0000 mg | DELAYED_RELEASE_CAPSULE | Freq: Every day | ORAL | 1 refills | Status: DC

## 2023-07-26 MED ORDER — AMLODIPINE BESYLATE 10 MG PO TABS: ORAL_TABLET | ORAL | 0 refills | Status: DC

## 2023-07-26 MED ORDER — VENLAFAXINE HCL ER 150 MG PO CP24: 150.0000 mg | ORAL_CAPSULE | Freq: Every day | ORAL | 3 refills | Status: DC

## 2023-07-26 MED ORDER — ATORVASTATIN CALCIUM 10 MG PO TABS: 10.0000 mg | ORAL_TABLET | Freq: Every day | ORAL | 3 refills | Status: DC

## 2023-07-26 MED ORDER — BUPROPION HCL ER (XL) 150 MG PO TB24: 150.0000 mg | ORAL_TABLET | Freq: Every day | ORAL | 2 refills | Status: DC

## 2023-07-29 ENCOUNTER — Encounter: Payer: Managed Care, Other (non HMO) | Admitting: Physician Assistant

## 2023-08-05 ENCOUNTER — Encounter: Payer: Managed Care, Other (non HMO) | Admitting: Physician Assistant

## 2023-08-21 LAB — LIPID PANEL WITH LDL/HDL RATIO
Cholesterol, Total: 151 mg/dL (ref 100–199)
HDL: 87 mg/dL (ref >39)
LDL Chol Calc (NIH): 53 mg/dL (ref 0–99)
LDL/HDL Ratio: 0.6 {ratio} (ref 0.0–3.2)
Triglycerides: 51 mg/dL (ref 0–149)
VLDL Cholesterol Cal: 11 mg/dL (ref 5–40)

## 2023-08-21 LAB — COMPREHENSIVE METABOLIC PANEL
ALT: 20 [IU]/L (ref 0–32)
AST: 14 [IU]/L (ref 0–40)
Albumin: 4.5 g/dL (ref 3.9–4.9)
Alkaline Phosphatase: 105 [IU]/L (ref 44–121)
BUN/Creatinine Ratio: 19 (ref 12–28)
BUN: 17 mg/dL (ref 8–27)
Bilirubin Total: 0.2 mg/dL (ref 0.0–1.2)
CO2: 24 mmol/L (ref 20–29)
Calcium: 9.6 mg/dL (ref 8.7–10.3)
Chloride: 107 mmol/L — ABNORMAL HIGH (ref 96–106)
Creatinine, Ser: 0.89 mg/dL (ref 0.57–1.00)
Globulin, Total: 2.5 g/dL (ref 1.5–4.5)
Glucose: 96 mg/dL (ref 70–99)
Potassium: 4 mmol/L (ref 3.5–5.2)
Sodium: 144 mmol/L (ref 134–144)
Total Protein: 7 g/dL (ref 6.0–8.5)
eGFR: 72 mL/min/{1.73_m2} (ref >59)

## 2023-08-21 LAB — CBC WITH DIFFERENTIAL/PLATELET
Basophils Absolute: 0.1 10*3/uL (ref 0.0–0.2)
Basos: 1 %
EOS (ABSOLUTE): 0.4 10*3/uL (ref 0.0–0.4)
Eos: 6 %
Hematocrit: 39.1 % (ref 34.0–46.6)
Hemoglobin: 12.5 g/dL (ref 11.1–15.9)
Immature Grans (Abs): 0 10*3/uL (ref 0.0–0.1)
Immature Granulocytes: 0 %
Lymphocytes Absolute: 2 10*3/uL (ref 0.7–3.1)
Lymphs: 35 %
MCH: 28 pg (ref 26.6–33.0)
MCHC: 32 g/dL (ref 31.5–35.7)
MCV: 88 fL (ref 79–97)
Monocytes Absolute: 0.4 10*3/uL (ref 0.1–0.9)
Monocytes: 8 %
Neutrophils Absolute: 2.9 10*3/uL (ref 1.4–7.0)
Neutrophils: 50 %
Platelets: 338 10*3/uL (ref 150–450)
RBC: 4.46 x10E6/uL (ref 3.77–5.28)
RDW: 14.2 % (ref 11.7–15.4)
WBC: 5.7 10*3/uL (ref 3.4–10.8)

## 2023-08-21 LAB — TSH+FREE T4
Free T4: 1.17 ng/dL (ref 0.82–1.77)
TSH: 2.26 u[IU]/mL (ref 0.450–4.500)

## 2023-08-30 ENCOUNTER — Encounter: Payer: Self-pay | Admitting: Physician Assistant

## 2023-08-30 ENCOUNTER — Ambulatory Visit: Payer: Managed Care, Other (non HMO) | Admitting: Physician Assistant

## 2023-08-30 VITALS — BP 122/90 | HR 87 | Temp 97.9°F | Resp 16 | Ht 64.0 in | Wt 151.0 lb

## 2023-08-30 DIAGNOSIS — F411 Generalized anxiety disorder: Secondary | ICD-10-CM | POA: Diagnosis not present

## 2023-08-30 DIAGNOSIS — E7800 Pure hypercholesterolemia, unspecified: Secondary | ICD-10-CM

## 2023-08-30 DIAGNOSIS — G4733 Obstructive sleep apnea (adult) (pediatric): Secondary | ICD-10-CM

## 2023-08-30 DIAGNOSIS — I10 Essential (primary) hypertension: Secondary | ICD-10-CM

## 2023-08-30 DIAGNOSIS — K219 Gastro-esophageal reflux disease without esophagitis: Secondary | ICD-10-CM

## 2023-08-30 MED ORDER — BUPROPION HCL ER (XL) 150 MG PO TB24: 150.0000 mg | ORAL_TABLET | Freq: Every day | ORAL | 1 refills | Status: DC

## 2023-08-30 MED ORDER — METOPROLOL SUCCINATE ER 25 MG PO TB24: 12.5000 mg | ORAL_TABLET | Freq: Every day | ORAL | 3 refills | Status: DC

## 2023-08-30 MED ORDER — OMEPRAZOLE 40 MG PO CPDR: 40.0000 mg | DELAYED_RELEASE_CAPSULE | Freq: Every day | ORAL | 1 refills | Status: DC

## 2023-08-30 MED ORDER — AMLODIPINE BESYLATE 10 MG PO TABS: ORAL_TABLET | ORAL | 0 refills | Status: DC

## 2023-08-30 MED ORDER — ATORVASTATIN CALCIUM 10 MG PO TABS: 10.0000 mg | ORAL_TABLET | Freq: Every day | ORAL | 3 refills | Status: DC

## 2023-08-30 MED ORDER — VENLAFAXINE HCL ER 150 MG PO CP24: 150.0000 mg | ORAL_CAPSULE | Freq: Every day | ORAL | 3 refills | Status: DC
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
## 2023-09-30 ENCOUNTER — Encounter: Payer: Self-pay | Admitting: Physician Assistant

## 2023-09-30 ENCOUNTER — Ambulatory Visit (INDEPENDENT_AMBULATORY_CARE_PROVIDER_SITE_OTHER): Payer: Managed Care, Other (non HMO) | Admitting: Physician Assistant

## 2023-09-30 VITALS — BP 130/80 | HR 90 | Temp 98.4°F | Resp 16 | Ht 64.0 in | Wt 150.0 lb

## 2023-09-30 DIAGNOSIS — F411 Generalized anxiety disorder: Secondary | ICD-10-CM

## 2023-09-30 DIAGNOSIS — I10 Essential (primary) hypertension: Secondary | ICD-10-CM
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
## 2023-10-01 ENCOUNTER — Telehealth: Payer: Self-pay | Admitting: Physician Assistant

## 2023-10-01 NOTE — Telephone Encounter (Signed)
Home SS order faxed to [facility]; [phone]

## 2023-10-31 ENCOUNTER — Other Ambulatory Visit: Payer: Self-pay | Admitting: Internal Medicine

## 2023-10-31 DIAGNOSIS — Z1231 Encounter for screening mammogram for malignant neoplasm of breast: Secondary | ICD-10-CM

## 2023-11-13 ENCOUNTER — Ambulatory Visit
Admission: RE | Admit: 2023-11-13 | Discharge: 2023-11-13 | Disposition: A | Discharge status: Home / Self Care | Payer: Managed Care, Other (non HMO) | Source: Ambulatory Visit | Attending: Internal Medicine | Admitting: Internal Medicine

## 2023-11-13 DIAGNOSIS — Z1231 Encounter for screening mammogram for malignant neoplasm of breast: Secondary | ICD-10-CM | POA: Insufficient documentation

## 2023-11-21 ENCOUNTER — Other Ambulatory Visit: Payer: Self-pay | Admitting: Physician Assistant

## 2023-11-21 DIAGNOSIS — I10 Essential (primary) hypertension: Secondary | ICD-10-CM

## 2023-12-12 ENCOUNTER — Telehealth: Payer: Self-pay | Admitting: Physician Assistant

## 2023-12-12 NOTE — Telephone Encounter (Signed)
 Per [facility], unable to reach patient to schedule HST. Lvm for patient to return my call-Toni

## 2023-12-18 ENCOUNTER — Telehealth: Payer: Self-pay | Admitting: Physician Assistant

## 2023-12-18 NOTE — Telephone Encounter (Signed)
HST appointment 12/30/23 @ [facility]

## 2023-12-21 ENCOUNTER — Other Ambulatory Visit: Payer: Self-pay | Admitting: Physician Assistant

## 2023-12-21 DIAGNOSIS — I10 Essential (primary) hypertension: Secondary | ICD-10-CM

## 2023-12-31 ENCOUNTER — Encounter (INDEPENDENT_AMBULATORY_CARE_PROVIDER_SITE_OTHER): Payer: Self-pay | Admitting: Internal Medicine

## 2023-12-31 DIAGNOSIS — G4733 Obstructive sleep apnea (adult) (pediatric): Secondary | ICD-10-CM

## 2024-01-14 NOTE — Procedures (Signed)
 Study Date: 12/31/2023  Patient Name: Jacqueline Arnold, Jacqueline Arnold Recording Device: Tawana Scale  Sex: Female Height: 64.0 in.  D.O.B.: [birth year] Weight: 151.0 lbs.  Age: 68 years B.M.I: 25.9 lb/in2   Times and Durations  Lights off clock time:  12:25:56 AM Total Recording Time (TRT): 248.4 minutes  Lights on clock time: 4:34:20 AM Time In Bed (TIB): 248.4 minutes    Monitoring Time (MT): 174.4 minutes   Device and Sensor Details  The study was recorded on a [employer] device using 1 RIP effort belt and a pressure-based flow sensor. The heart rate is derived from the oximeter sensor and the snore signal is derived from the pressure sensor. The device also records body position and uses it to determine the monitoring time (sleep/wake periods).   Summary  REI 10.3 OAI 8.3 CAI 0.7 Lowest Desat 83  REI is the number of respiratory events per hour. OAI is the number of obstructive apneas per hour. CAI is the number of central apneas per hour.  RESPIRATORY EVENTS   Index (#/hour) Total # of Events Mean duration  (sec) Max duration  (sec) # of Events by Position       Supine Prone Left Right Up  Central Apneas 0.7 2 17.0 24.0 2       0 0  Obstructive Apneas 8.3 24 11.7 21.0 24       0 0  Mixed Apneas 0.3 1 10.0 10.0 1       0 0  Hypopneas 1.0 3 20.2 28.0 3       0 0  Apneas + Hypopneas 10.3 30 12.8 28.0 30       0 0  RERAs 0.0 0 0.0 0.0 0       0 0  Total 10.3 30 12.8 28.0 30       0 0  Time in Position 155.5       19.5 66.9  REI in Position 11.6       0.0 0.0    Positional Summary   Supine Non-Supine  Total # of Events 30 0.00  Total Duration (minutes) 155.5 86.40  Sleep Duration (minutes) 155.4 19.00  REI in Position 11.6 0.00  REISupine / REINon-Supine Ratio 0.00   Positional Sleep Apnea is indicated if REI (supine) >= 2 x REI (non-supine) per Blain Pais, Sleep. 1984;7(2).      Oximetry Summary   Dur. (min) % TIB  <90 % 42.4 17.1  <85 % 15.6 6.3   <80 % 0.0 0.0  <70 % 0.0 0.0  Total Dur (min) < 89 41.2 min  Average (%) 94  Total # of Desats 29  Desat Index (#/hour) 11.6  Desat Max (%) 5  Desat Max dur (sec) 55.0  Lowest SpO2 % during sleep 83  Duration of Min SpO2 (sec) 89  Highest SpO2 % during sleep 100  Duration of Max SpO2(sec) 300    Heart Rate Stats  Mean HR during sleep 76.7 (BPM)  Highest HR during sleep 238  (BPM)  Highest HR during TIB  238 (BPM)  Lowest HR during sleep 65  (BPM)  Lowest HR during TIB 65 (BPM)    Snoring Summary  Total Snoring Episodes 148  Total Duration with Snoring 30.5 minutes  Mean Duration of Snoring 12.3 seconds  Percentage of Snoring 17.5 %               [facility]  Portable Polysomnogram Report Part 1 Phone: (  [phone] Fax: [phone]  Patient Name: Jacqueline, Arnold Recording Device: Tawana Scale  D.O.B.: [birth year] Acquisition Number: [id number]  Referring Physician: Lynn Ito, PA-C Acquisition Date: 12/31/2023  . History: The patient is a 68 year old female who was referred for evaluation of possible sleep apnea with excessive  daytime sleepiness. Medical History: OSA, depression, GERD, hypertension.  Medications: metoprolol amlodipine, atorvastatin, bupropion, omeprazole, venlafaxine.  PROCEDURE  The unattended portable polysomnogram was conducted on the night of 12/31/2023.  The following parameters  were monitored: Nasal and oral airflow, and body position. Additionally, thoracic and abdominal movements were  recorded by inductance plethysmography. Oxygen saturation (SpO2) and heart rate (ECG) was monitored using  a pulse oximeter.  The tracing was scored using 30 second epochs. Hypopneas were scored per AASM definition  VIII4.B (4% desaturation).   Description: The total recording time was 248.4 minutes. Sleep parameters are not recorded.  Respiratory monitoring demonstrated  snoring across the night in all  positions. There were a total of 30  apneas and hypopneas for a Respiratory Event Index of 10.3 apneas and hypopneas per hour of recording. All of the respiratory events occurred in the supine position with an REI of 11.9. The average duration of the respiratory events  was 12.8 seconds with a maximum duration of 28.0 seconds. The respiratory events were associated with peripheral  oxygen desaturations on the average to 94%. The lowest oxygen desaturation associated with a respiratory event  was 83%. The total duration of oxygen < 90% was 42.4 minutes and <80% was 0 minutes.   Cardiac monitoring- The average heart rate during the recording was 76.7 bpm.  Impression: This routine overnight portable polysomnogram demonstrated severe obstructive sleep apnea. Overall, the Respiratory  Event Index was 10.3 apneas and hypopneas per hour of recording with the lowest desaturation to 83 %. All of the respiratory events occurred in the supine position with an REI of 11.9.  Recommendations:     A CPAP titration study is recommended due to the severity of the sleep apnea. Some supine sleep should be  ensured to optimize the titration.   Yevonne Pax, MD [facility] Diplomate ABMS Pulmonary Critical Care and Sleep Medicine Electronically reviewed and digitally signed

## 2024-01-20 ENCOUNTER — Ambulatory Visit (INDEPENDENT_AMBULATORY_CARE_PROVIDER_SITE_OTHER): Payer: Managed Care, Other (non HMO) | Admitting: Physician Assistant

## 2024-01-20 ENCOUNTER — Encounter: Payer: Self-pay | Admitting: Physician Assistant

## 2024-01-20 VITALS — BP 120/90 | HR 84 | Temp 98.3°F | Resp 16 | Ht 64.0 in | Wt 153.6 lb

## 2024-01-20 DIAGNOSIS — I10 Essential (primary) hypertension: Secondary | ICD-10-CM

## 2024-01-20 DIAGNOSIS — G4733 Obstructive sleep apnea (adult) (pediatric): Secondary | ICD-10-CM

## 2024-01-20 DIAGNOSIS — F411 Generalized anxiety disorder: Secondary | ICD-10-CM

## 2024-01-20 MED ORDER — BUPROPION HCL ER (XL) 150 MG PO TB24: 150.0000 mg | ORAL_TABLET | Freq: Every day | ORAL | 1 refills | Status: DC
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
## 2024-03-11 ENCOUNTER — Other Ambulatory Visit: Payer: Self-pay | Admitting: Physician Assistant

## 2024-03-11 DIAGNOSIS — K219 Gastro-esophageal reflux disease without esophagitis: Secondary | ICD-10-CM

## 2024-04-20 ENCOUNTER — Encounter: Payer: Self-pay | Admitting: Physician Assistant

## 2024-04-20 ENCOUNTER — Ambulatory Visit: Admitting: Physician Assistant

## 2024-04-20 VITALS — BP 123/60 | HR 98 | Temp 98.5°F | Resp 16 | Ht 64.0 in | Wt 158.0 lb

## 2024-04-20 DIAGNOSIS — I10 Essential (primary) hypertension: Secondary | ICD-10-CM

## 2024-04-20 DIAGNOSIS — G4733 Obstructive sleep apnea (adult) (pediatric): Secondary | ICD-10-CM | POA: Diagnosis not present

## 2024-04-20 DIAGNOSIS — F411 Generalized anxiety disorder: Secondary | ICD-10-CM | POA: Diagnosis not present
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
## 2024-04-29 ENCOUNTER — Telehealth: Payer: Self-pay | Admitting: Physician Assistant

## 2024-04-29 NOTE — Telephone Encounter (Signed)
 Lvm & sent mychart msg to move 08/03/24 appointment -Andree

## 2024-05-21 ENCOUNTER — Telehealth: Payer: Self-pay | Admitting: Physician Assistant

## 2024-05-21 NOTE — Telephone Encounter (Signed)
 Left 3rd message to r/s 08/03/24 appointment & mailed letter-Toni

## 2024-07-30 ENCOUNTER — Encounter: Payer: Managed Care, Other (non HMO) | Admitting: Physician Assistant

## 2024-08-03 ENCOUNTER — Encounter: Admitting: Physician Assistant

## 2024-08-10 ENCOUNTER — Other Ambulatory Visit: Payer: Self-pay | Admitting: Physician Assistant

## 2024-08-10 DIAGNOSIS — E7800 Pure hypercholesterolemia, unspecified: Secondary | ICD-10-CM

## 2024-08-10 DIAGNOSIS — F411 Generalized anxiety disorder: Secondary | ICD-10-CM

## 2024-08-14 ENCOUNTER — Ambulatory Visit: Admitting: Physician Assistant

## 2024-08-14 ENCOUNTER — Encounter: Payer: Self-pay | Admitting: Physician Assistant

## 2024-08-14 VITALS — BP 132/76 | HR 90 | Temp 98.3°F | Resp 16 | Ht 64.0 in | Wt 157.0 lb

## 2024-08-14 DIAGNOSIS — F411 Generalized anxiety disorder: Secondary | ICD-10-CM | POA: Diagnosis not present

## 2024-08-14 DIAGNOSIS — E7800 Pure hypercholesterolemia, unspecified: Secondary | ICD-10-CM | POA: Diagnosis not present

## 2024-08-14 DIAGNOSIS — K219 Gastro-esophageal reflux disease without esophagitis: Secondary | ICD-10-CM | POA: Diagnosis not present

## 2024-08-14 DIAGNOSIS — M8580 Other specified disorders of bone density and structure, unspecified site: Secondary | ICD-10-CM

## 2024-08-14 DIAGNOSIS — Z1231 Encounter for screening mammogram for malignant neoplasm of breast: Secondary | ICD-10-CM

## 2024-08-14 DIAGNOSIS — G4733 Obstructive sleep apnea (adult) (pediatric): Secondary | ICD-10-CM

## 2024-08-14 DIAGNOSIS — Z0001 Encounter for general adult medical examination with abnormal findings: Secondary | ICD-10-CM | POA: Diagnosis not present

## 2024-08-14 DIAGNOSIS — I10 Essential (primary) hypertension: Secondary | ICD-10-CM | POA: Diagnosis not present

## 2024-08-14 MED ORDER — VENLAFAXINE HCL ER 150 MG PO CP24: 150.0000 mg | ORAL_CAPSULE | Freq: Every day | ORAL | 0 refills | Status: DC

## 2024-08-14 MED ORDER — AMLODIPINE BESYLATE 10 MG PO TABS: 10.0000 mg | ORAL_TABLET | Freq: Every day | ORAL | 3 refills | Status: AC

## 2024-08-14 MED ORDER — PREVNAR 20 0.5 ML IM SUSY: 0.5000 mL | PREFILLED_SYRINGE | Freq: Once | INTRAMUSCULAR | 0 refills | Status: AC

## 2024-08-14 MED ORDER — SHINGRIX 50 MCG/0.5ML IM SUSR: 0.5000 mL | Freq: Once | INTRAMUSCULAR | 0 refills | Status: AC

## 2024-08-14 MED ORDER — ATORVASTATIN CALCIUM 10 MG PO TABS: 10.0000 mg | ORAL_TABLET | Freq: Every day | ORAL | 0 refills | Status: DC

## 2024-08-14 MED ORDER — METOPROLOL SUCCINATE ER 25 MG PO TB24: 12.5000 mg | ORAL_TABLET | Freq: Every day | ORAL | 1 refills | Status: AC

## 2024-08-14 MED ORDER — BUPROPION HCL ER (XL) 150 MG PO TB24: 150.0000 mg | ORAL_TABLET | Freq: Every day | ORAL | 1 refills | Status: AC

## 2024-08-14 MED ORDER — ZEPBOUND 2.5 MG/0.5ML ~~LOC~~ SOAJ: 2.5000 mg | SUBCUTANEOUS | 2 refills | Status: AC

## 2024-08-14 MED ORDER — OMEPRAZOLE 40 MG PO CPDR
40.0000 mg | DELAYED_RELEASE_CAPSULE | Freq: Every day | ORAL | 3 refills | Status: AC
Start: 2024-08-14 — End: ?
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
## 2024-11-02 ENCOUNTER — Telehealth: Payer: Self-pay

## 2024-11-02 NOTE — Telephone Encounter (Addendum)
 Completed P.A. for patient's Zepbound . Pt denied and notified.

## 2024-11-03 ENCOUNTER — Other Ambulatory Visit: Payer: Self-pay | Admitting: Physician Assistant

## 2024-11-03 DIAGNOSIS — E7800 Pure hypercholesterolemia, unspecified: Secondary | ICD-10-CM

## 2024-11-03 DIAGNOSIS — F411 Generalized anxiety disorder: Secondary | ICD-10-CM

## 2024-11-20 ENCOUNTER — Other Ambulatory Visit: Payer: Self-pay | Admitting: Physician Assistant

## 2024-11-21 LAB — CBC WITH DIFFERENTIAL/PLATELET
Basophils Absolute: 0 x10E3/uL (ref 0.0–0.2)
Basos: 1 %
EOS (ABSOLUTE): 0.3 x10E3/uL (ref 0.0–0.4)
Eos: 5 %
Hematocrit: 36 % (ref 34.0–46.6)
Hemoglobin: 11.6 g/dL (ref 11.1–15.9)
Immature Grans (Abs): 0 x10E3/uL (ref 0.0–0.1)
Immature Granulocytes: 0 %
Lymphocytes Absolute: 1.5 x10E3/uL (ref 0.7–3.1)
Lymphs: 31 %
MCH: 27.4 pg (ref 26.6–33.0)
MCHC: 32.2 g/dL (ref 31.5–35.7)
MCV: 85 fL (ref 79–97)
Monocytes Absolute: 0.4 x10E3/uL (ref 0.1–0.9)
Monocytes: 8 %
Neutrophils Absolute: 2.7 x10E3/uL (ref 1.4–7.0)
Neutrophils: 55 %
Platelets: 296 x10E3/uL (ref 150–450)
RBC: 4.24 x10E6/uL (ref 3.77–5.28)
RDW: 14.2 % (ref 11.7–15.4)
WBC: 4.8 x10E3/uL (ref 3.4–10.8)

## 2024-11-21 LAB — COMPREHENSIVE METABOLIC PANEL WITH GFR
ALT: 18 IU/L (ref 0–32)
AST: 19 IU/L (ref 0–40)
Albumin: 3.9 g/dL (ref 3.9–4.9)
Alkaline Phosphatase: 137 IU/L — ABNORMAL HIGH (ref 49–135)
BUN/Creatinine Ratio: 14 (ref 12–28)
BUN: 13 mg/dL (ref 8–27)
Bilirubin Total: 0.3 mg/dL (ref 0.0–1.2)
CO2: 22 mmol/L (ref 20–29)
Calcium: 8.7 mg/dL (ref 8.7–10.3)
Chloride: 107 mmol/L — ABNORMAL HIGH (ref 96–106)
Creatinine, Ser: 0.93 mg/dL (ref 0.57–1.00)
Globulin, Total: 2.3 g/dL (ref 1.5–4.5)
Glucose: 104 mg/dL — ABNORMAL HIGH (ref 70–99)
Potassium: 4.3 mmol/L (ref 3.5–5.2)
Sodium: 141 mmol/L (ref 134–144)
Total Protein: 6.2 g/dL (ref 6.0–8.5)
eGFR: 68 mL/min/1.73

## 2024-11-21 LAB — TSH: TSH: 1.39 u[IU]/mL (ref 0.450–4.500)

## 2024-11-21 LAB — T4, FREE: Free T4: 0.92 ng/dL (ref 0.82–1.77)

## 2024-11-24 ENCOUNTER — Ambulatory Visit: Payer: Self-pay | Admitting: Physician Assistant

## 2024-11-30 ENCOUNTER — Other Ambulatory Visit

## 2024-11-30 ENCOUNTER — Encounter

## 2024-12-04 ENCOUNTER — Ambulatory Visit: Payer: Self-pay | Admitting: Physician Assistant

## 2024-12-28 ENCOUNTER — Ambulatory Visit: Payer: Self-pay | Admitting: Physician Assistant

## 2025-08-16 ENCOUNTER — Encounter: Payer: Self-pay | Admitting: Physician Assistant
